# Patient Record
Sex: Male | Born: 1935
Health system: Southern US, Community
[De-identification: ages and names within clinical notes are randomized; demographics above are authoritative.]

## PROBLEM LIST (undated history)

## (undated) DIAGNOSIS — I10 Essential (primary) hypertension: Secondary | ICD-10-CM

## (undated) DIAGNOSIS — K259 Gastric ulcer, unspecified as acute or chronic, without hemorrhage or perforation: Secondary | ICD-10-CM

## (undated) DIAGNOSIS — N189 Chronic kidney disease, unspecified: Secondary | ICD-10-CM

## (undated) DIAGNOSIS — E785 Hyperlipidemia, unspecified: Secondary | ICD-10-CM

## (undated) DIAGNOSIS — R809 Proteinuria, unspecified: Secondary | ICD-10-CM

## (undated) DIAGNOSIS — K219 Gastro-esophageal reflux disease without esophagitis: Secondary | ICD-10-CM

## (undated) DIAGNOSIS — E119 Type 2 diabetes mellitus without complications: Secondary | ICD-10-CM

## (undated) DIAGNOSIS — R350 Frequency of micturition: Secondary | ICD-10-CM

## (undated) DIAGNOSIS — Z9289 Personal history of other medical treatment: Secondary | ICD-10-CM

## (undated) DIAGNOSIS — H409 Unspecified glaucoma: Secondary | ICD-10-CM

## (undated) DIAGNOSIS — Z8709 Personal history of other diseases of the respiratory system: Secondary | ICD-10-CM

## (undated) DIAGNOSIS — D649 Anemia, unspecified: Secondary | ICD-10-CM

## (undated) DIAGNOSIS — L988 Other specified disorders of the skin and subcutaneous tissue: Secondary | ICD-10-CM

## (undated) DIAGNOSIS — G629 Polyneuropathy, unspecified: Secondary | ICD-10-CM

## (undated) HISTORY — DX: Proteinuria, unspecified: R80.9

## (undated) HISTORY — DX: Type 2 diabetes mellitus without complications: E11.9

## (undated) HISTORY — PX: ESOPHAGOGASTRODUODENOSCOPY: SHX1529

## (undated) HISTORY — DX: Chronic kidney disease, unspecified: N18.9

## (undated) HISTORY — DX: Essential (primary) hypertension: I10

## (undated) HISTORY — DX: Anemia, unspecified: D64.9

## (undated) HISTORY — PX: COLONOSCOPY: SHX174

## (undated) HISTORY — PX: OTHER SURGICAL HISTORY: SHX169

## (undated) HISTORY — DX: Hyperlipidemia, unspecified: E78.5

---

## 2012-06-20 ENCOUNTER — Other Ambulatory Visit (HOSPITAL_COMMUNITY): Payer: Self-pay | Admitting: Nephrology

## 2012-06-20 DIAGNOSIS — N289 Disorder of kidney and ureter, unspecified: Secondary | ICD-10-CM

## 2012-07-12 ENCOUNTER — Ambulatory Visit (HOSPITAL_COMMUNITY)
Admission: RE | Admit: 2012-07-12 | Discharge: 2012-07-12 | Disposition: A | Discharge status: Home / Self Care | Payer: Medicare Other | Source: Ambulatory Visit | Attending: Nephrology | Admitting: Nephrology

## 2012-07-12 DIAGNOSIS — Q619 Cystic kidney disease, unspecified: Secondary | ICD-10-CM | POA: Insufficient documentation

## 2012-07-12 DIAGNOSIS — N289 Disorder of kidney and ureter, unspecified: Secondary | ICD-10-CM | POA: Insufficient documentation

## 2012-08-16 ENCOUNTER — Encounter (INDEPENDENT_AMBULATORY_CARE_PROVIDER_SITE_OTHER): Payer: Medicare Other | Admitting: Ophthalmology

## 2012-08-16 DIAGNOSIS — E11319 Type 2 diabetes mellitus with unspecified diabetic retinopathy without macular edema: Secondary | ICD-10-CM

## 2012-08-16 DIAGNOSIS — H43819 Vitreous degeneration, unspecified eye: Secondary | ICD-10-CM

## 2012-08-16 DIAGNOSIS — E1139 Type 2 diabetes mellitus with other diabetic ophthalmic complication: Secondary | ICD-10-CM

## 2012-10-04 ENCOUNTER — Encounter (INDEPENDENT_AMBULATORY_CARE_PROVIDER_SITE_OTHER): Payer: Medicare Other | Admitting: Ophthalmology

## 2012-10-04 DIAGNOSIS — E1139 Type 2 diabetes mellitus with other diabetic ophthalmic complication: Secondary | ICD-10-CM

## 2012-10-04 DIAGNOSIS — E1165 Type 2 diabetes mellitus with hyperglycemia: Secondary | ICD-10-CM

## 2012-10-04 DIAGNOSIS — E11319 Type 2 diabetes mellitus with unspecified diabetic retinopathy without macular edema: Secondary | ICD-10-CM

## 2012-10-04 DIAGNOSIS — H43819 Vitreous degeneration, unspecified eye: Secondary | ICD-10-CM

## 2012-10-04 DIAGNOSIS — H35359 Cystoid macular degeneration, unspecified eye: Secondary | ICD-10-CM

## 2012-11-15 ENCOUNTER — Encounter (INDEPENDENT_AMBULATORY_CARE_PROVIDER_SITE_OTHER): Payer: Medicare Other | Admitting: Ophthalmology

## 2012-11-15 DIAGNOSIS — H43819 Vitreous degeneration, unspecified eye: Secondary | ICD-10-CM

## 2012-11-15 DIAGNOSIS — H35359 Cystoid macular degeneration, unspecified eye: Secondary | ICD-10-CM

## 2012-11-15 DIAGNOSIS — E1139 Type 2 diabetes mellitus with other diabetic ophthalmic complication: Secondary | ICD-10-CM

## 2012-11-15 DIAGNOSIS — E11319 Type 2 diabetes mellitus with unspecified diabetic retinopathy without macular edema: Secondary | ICD-10-CM

## 2013-03-30 ENCOUNTER — Other Ambulatory Visit: Payer: Self-pay | Admitting: *Deleted

## 2013-03-30 ENCOUNTER — Encounter: Payer: Self-pay | Admitting: Vascular Surgery

## 2013-03-30 DIAGNOSIS — N184 Chronic kidney disease, stage 4 (severe): Secondary | ICD-10-CM

## 2013-03-30 DIAGNOSIS — Z01810 Encounter for preprocedural cardiovascular examination: Secondary | ICD-10-CM

## 2013-04-10 ENCOUNTER — Encounter: Payer: Self-pay | Admitting: Vascular Surgery

## 2013-04-11 ENCOUNTER — Ambulatory Visit (INDEPENDENT_AMBULATORY_CARE_PROVIDER_SITE_OTHER): Payer: Medicare Other | Admitting: Vascular Surgery

## 2013-04-11 ENCOUNTER — Other Ambulatory Visit: Payer: Self-pay | Admitting: *Deleted

## 2013-04-11 ENCOUNTER — Encounter (INDEPENDENT_AMBULATORY_CARE_PROVIDER_SITE_OTHER): Payer: Medicare Other | Admitting: *Deleted

## 2013-04-11 ENCOUNTER — Encounter (HOSPITAL_COMMUNITY): Payer: Self-pay | Admitting: Pharmacy Technician

## 2013-04-11 ENCOUNTER — Encounter: Payer: Self-pay | Admitting: Vascular Surgery

## 2013-04-11 VITALS — BP 183/88 | HR 77 | Ht 72.0 in | Wt 194.9 lb

## 2013-04-11 DIAGNOSIS — N184 Chronic kidney disease, stage 4 (severe): Secondary | ICD-10-CM

## 2013-04-11 DIAGNOSIS — Z01810 Encounter for preprocedural cardiovascular examination: Secondary | ICD-10-CM

## 2013-04-11 DIAGNOSIS — N186 End stage renal disease: Secondary | ICD-10-CM | POA: Insufficient documentation

## 2013-04-11 NOTE — Progress Notes (Signed)
[facility] [place]  Referred by:  Harriett Sine, MD [address] [place], [place] [address]  Reason for referral: New access  History of Present Illness  Nathaniel Martinez is a 79 y.o. ([birth year]) male who presents for evaluation for permanent access.  The patient is right hand dominant.  The patient has not had previous access procedures.  Previous central venous cannulation procedures include: non.  The patient has not had a  PPM placed.   Past Medical History  Diagnosis Date  . Chronic kidney disease   . Hypertension   . Anemia   . Diabetes mellitus without complication   . Hyperlipidemia   . Proteinuria     Past Surgical History  Procedure Laterality Date  . Vasectomy      History   Social History  . Marital Status: Married    Spouse Name: N/A    Number of Children: N/A  . Years of Education: N/A   Occupational History  . Not on file.   Social History Main Topics  . Smoking status: Former Smoker    Types: Cigarettes    Quit date: 11/01/1966  . Smokeless tobacco: Not on file  . Alcohol Use: No  . Drug Use: No  . Sexually Active: Not on file   Other Topics Concern  . Not on file   Social History Narrative  . No narrative on file    Family History  Problem Relation Age of Onset  . Cancer Mother   . Cancer Brother   . Diabetes Brother   . Heart attack Brother   . Other Brother     amputation      Current Outpatient Prescriptions on File Prior to Visit  Medication Sig Dispense Refill  . Cholecalciferol (VITAMIN D3) 1000 UNITS CAPS Take 1,000 capsules by mouth daily.      Marland Kitchen glipiZIDE (GLUCOTROL) 5 MG tablet Take 5 mg by mouth daily.      Marland Kitchen losartan (COZAAR) 50 MG tablet Take 50 mg by mouth daily. Take 11/2 tablets daily      . simvastatin (ZOCOR) 20 MG tablet Take 20 mg by mouth every evening.      . sitaGLIPtin (JANUVIA) 25 MG tablet Take 25 mg by mouth daily.       No current facility-administered medications  on file prior to visit.    Allergies  Allergen Reactions  . Penicillins Other (See Comments)    Pt states "it had been 40 years ago and I don't remember there reaction"      REVIEW OF SYSTEMS:  (Positives checked otherwise negative)  CARDIOVASCULAR:  []  chest pain, []  chest pressure, []  palpitations, []  shortness of breath when laying flat, []  shortness of breath with exertion,  []  pain in feet when walking, []  pain in feet when laying flat, []  history of blood clot in veins (DVT), []  history of phlebitis, []  swelling in legs, []  varicose veins  PULMONARY:  []  productive cough, []  asthma, []  wheezing  NEUROLOGIC:  []  weakness in arms or legs, []  numbness in arms or legs, []  difficulty speaking or slurred speech, []  temporary loss of vision in one eye, []  dizziness  HEMATOLOGIC:  []  bleeding problems, []  problems with blood clotting too easily  MUSCULOSKEL:  [x]  joint pain, []  joint swelling  GASTROINTEST:  []  vomiting blood, []  blood in stool     GENITOURINARY:  []  burning with urination, []  blood in urine  PSYCHIATRIC:  []  history of major depression  INTEGUMENTARY:  []  rashes, []  ulcers  CONSTITUTIONAL:  []  fever, []  chills  PRE-ADM LIVING: [x]  Home, []  Nursing home, []  Homeless  AMB STATUS: []  Walking, [x]  Walking w/ Assistance cane, []  Wheelchair, [] Bed ridden   Physical Examination  Filed Vitals:   04/11/13 1055  BP: 183/88  Pulse: 77  Height: 6' (1.829 m)  Weight: 194 lb 14.4 oz (88.406 kg)  SpO2: 100%    Body mass index is 26.43 kg/(m^2).  General: A&O x 3, WDN, Head: [place]/AT, Temporalis wasting, , Prominent temp pulse,  Ear/Nose/Throat: Hearing grossly intact, nares w/o erythema or drainage, oropharynx w/o Erythema/Exudate, Mallampati score: , , Hearing loss uses hearing aids, , Nasal drainage  Eyes: PERRLA, EOMI, Post surg chg to pupils,  Neck: Supple,  Pulmonary: Sym exp, good air movt, CTAB, no rales, rhonchi, & wheezing, Cardiac:  RRR,Vascular: Vessel Right Left  Radial Palpable Palpable  Ulnar Palpable Palpable  Brachial Palpable Palpable  Carotid Palpable, without bruit Palpable, without bruit  Aorta Not palpable N/A  Femoral Palpable Palpable  Popliteal Not palpable Not palpable  PT  Palpable  Palpable  DP  Palpable  Palpable   Gastrointestinal: soft, NTND + BS Musculoskeletal: M/S 5/5 throughout, Extremities without ischemic changes  Neurologic: Pain and light touch intact in extremities Psychiatric: Judgment intact, Mood & affect appropriate for pt's clinical situation, Dermatologic: See M/S exam for extremity exam, no rashes otherwise noted  Lymph : No Cervical, Axillary, or Inguinal lymphadenopathy  e  Non-Invasive Vascular Imaging  Vein Mapping  ([id number] ):   Left cephalic .0.37 -0.44 AC to upper arm  Outside Studies/Documentation  outside documents were reviewed including: Labs and office notes.  Medical Decision Making  Nathaniel Martinez is a 79 y.o. male who presents with  ESRD requiring hemodialysis.   Based on vein mapping and examination, this patient's permanent access options include: Left cephalic vein  AV fistula verse graft creation 04/17/2013 by Dr. Scot Dock.  Theda Sers, Dorotea Hand [facility] PA-C [facility] [place] Office: [phone]   04/11/2013, 1:05 PM  Agree with above. He appears to be a good candidate for a left arm AV fistula. I have explained the indications for placement of an AV fistula or AV graft. I've explained that if at all possible we will place an AV fistula.  I have reviewed the risks of placement of an AV fistula including but not limited to: failure of the fistula to mature, need for subsequent interventions, and thrombosis. In addition I have reviewed the potential complications of placement of an AV graft. These risks include, but are not limited to, graft thrombosis, graft infection, wound healing problems, bleeding, arm swelling, and  steal syndrome. All the patient's questions were answered and they are agreeable to proceed with surgery.  Deitra Mayo, MD, [place] [phone] 04/11/2013

## 2013-04-12 ENCOUNTER — Encounter: Payer: Self-pay | Admitting: Nephrology

## 2013-04-16 ENCOUNTER — Encounter (HOSPITAL_COMMUNITY): Payer: Self-pay | Admitting: *Deleted

## 2013-04-16 MED ORDER — VANCOMYCIN HCL 10 G IV SOLR
1500.0000 mg | INTRAVENOUS | Status: DC
  Filled 2013-04-16: qty 1500

## 2013-04-16 NOTE — Progress Notes (Signed)
Pt doesn't have a cardiologist  Denies ever having a stress test/echo/heart cath  Medical Md is Dr.Fanta in [place]   Denies ekg or cxr in past yr

## 2013-04-17 ENCOUNTER — Other Ambulatory Visit: Payer: Self-pay | Admitting: *Deleted

## 2013-04-17 ENCOUNTER — Encounter (HOSPITAL_COMMUNITY): Payer: Self-pay | Admitting: *Deleted

## 2013-04-17 ENCOUNTER — Ambulatory Visit (HOSPITAL_COMMUNITY): Payer: Medicare Other

## 2013-04-17 ENCOUNTER — Ambulatory Visit (HOSPITAL_COMMUNITY): Payer: Medicare Other | Admitting: Certified Registered"

## 2013-04-17 ENCOUNTER — Telehealth: Payer: Self-pay | Admitting: Vascular Surgery

## 2013-04-17 ENCOUNTER — Other Ambulatory Visit: Payer: Self-pay

## 2013-04-17 ENCOUNTER — Encounter (HOSPITAL_COMMUNITY)
Admission: RE | Disposition: A | Discharge status: Home / Self Care | Payer: Self-pay | Source: Ambulatory Visit | Attending: Vascular Surgery

## 2013-04-17 ENCOUNTER — Ambulatory Visit (HOSPITAL_COMMUNITY)
Admission: RE | Admit: 2013-04-17 | Discharge: 2013-04-17 | Disposition: A | Discharge status: Home / Self Care | Payer: Medicare Other | Source: Ambulatory Visit | Attending: Vascular Surgery | Admitting: Vascular Surgery

## 2013-04-17 ENCOUNTER — Encounter (HOSPITAL_COMMUNITY): Payer: Self-pay | Admitting: Certified Registered"

## 2013-04-17 DIAGNOSIS — E669 Obesity, unspecified: Secondary | ICD-10-CM | POA: Insufficient documentation

## 2013-04-17 DIAGNOSIS — N186 End stage renal disease: Secondary | ICD-10-CM

## 2013-04-17 DIAGNOSIS — E785 Hyperlipidemia, unspecified: Secondary | ICD-10-CM | POA: Insufficient documentation

## 2013-04-17 DIAGNOSIS — K219 Gastro-esophageal reflux disease without esophagitis: Secondary | ICD-10-CM | POA: Insufficient documentation

## 2013-04-17 DIAGNOSIS — Z9852 Vasectomy status: Secondary | ICD-10-CM | POA: Insufficient documentation

## 2013-04-17 DIAGNOSIS — Z6825 Body mass index (BMI) 25.0-25.9, adult: Secondary | ICD-10-CM | POA: Insufficient documentation

## 2013-04-17 DIAGNOSIS — Z4931 Encounter for adequacy testing for hemodialysis: Secondary | ICD-10-CM

## 2013-04-17 DIAGNOSIS — I120 Hypertensive chronic kidney disease with stage 5 chronic kidney disease or end stage renal disease: Secondary | ICD-10-CM | POA: Insufficient documentation

## 2013-04-17 DIAGNOSIS — Z880 Allergy status to penicillin: Secondary | ICD-10-CM | POA: Insufficient documentation

## 2013-04-17 DIAGNOSIS — E119 Type 2 diabetes mellitus without complications: Secondary | ICD-10-CM | POA: Insufficient documentation

## 2013-04-17 DIAGNOSIS — D649 Anemia, unspecified: Secondary | ICD-10-CM | POA: Insufficient documentation

## 2013-04-17 DIAGNOSIS — Z87891 Personal history of nicotine dependence: Secondary | ICD-10-CM | POA: Insufficient documentation

## 2013-04-17 DIAGNOSIS — Z79899 Other long term (current) drug therapy: Secondary | ICD-10-CM | POA: Insufficient documentation

## 2013-04-17 HISTORY — DX: Personal history of other medical treatment: Z92.89

## 2013-04-17 HISTORY — DX: Gastric ulcer, unspecified as acute or chronic, without hemorrhage or perforation: K25.9

## 2013-04-17 HISTORY — PX: AV FISTULA PLACEMENT: SHX1204

## 2013-04-17 HISTORY — DX: Personal history of other diseases of the respiratory system: Z87.09

## 2013-04-17 HISTORY — DX: Gastro-esophageal reflux disease without esophagitis: K21.9

## 2013-04-17 HISTORY — DX: Frequency of micturition: R35.0

## 2013-04-17 HISTORY — DX: Polyneuropathy, unspecified: G62.9

## 2013-04-17 HISTORY — DX: Unspecified glaucoma: H40.9

## 2013-04-17 LAB — POCT I-STAT 4, (NA,K, GLUC, HGB,HCT)
HCT: 33 % — ABNORMAL LOW (ref 39.0–52.0)
Hemoglobin: 11.2 g/dL — ABNORMAL LOW (ref 13.0–17.0)

## 2013-04-17 LAB — GLUCOSE, CAPILLARY: Glucose-Capillary: 122 mg/dL — ABNORMAL HIGH (ref 70–99)

## 2013-04-17 SURGERY — ARTERIOVENOUS (AV) FISTULA CREATION
Anesthesia: Monitor Anesthesia Care | Site: Arm Lower | Laterality: Left | Wound class: Clean

## 2013-04-17 MED ORDER — PROPOFOL INFUSION 10 MG/ML OPTIME
INTRAVENOUS | Status: DC | PRN
  Administered 2013-04-17: 75 ug/kg/min via INTRAVENOUS

## 2013-04-17 MED ORDER — MIDAZOLAM HCL 5 MG/5ML IJ SOLN
INTRAMUSCULAR | Status: DC | PRN
  Administered 2013-04-17 (×2): 1 mg via INTRAVENOUS

## 2013-04-17 MED ORDER — LIDOCAINE HCL (CARDIAC) 20 MG/ML IV SOLN
INTRAVENOUS | Status: DC | PRN
  Administered 2013-04-17: 60 mg via INTRAVENOUS

## 2013-04-17 MED ORDER — ONDANSETRON HCL 4 MG/2ML IJ SOLN
INTRAMUSCULAR | Status: DC | PRN
  Administered 2013-04-17: 4 mg via INTRAVENOUS

## 2013-04-17 MED ORDER — 0.9 % SODIUM CHLORIDE (POUR BTL) OPTIME
TOPICAL | Status: DC | PRN
  Administered 2013-04-17: 1000 mL

## 2013-04-17 MED ORDER — OXYCODONE-ACETAMINOPHEN 5-325 MG PO TABS: 1.0000 | ORAL_TABLET | ORAL | Status: DC | PRN

## 2013-04-17 MED ORDER — SODIUM CHLORIDE 0.9 % IR SOLN
Status: DC | PRN
  Administered 2013-04-17: 10:00:00

## 2013-04-17 MED ORDER — MUPIROCIN 2 % EX OINT
TOPICAL_OINTMENT | CUTANEOUS | Status: AC
  Filled 2013-04-17: qty 22

## 2013-04-17 MED ORDER — OXYCODONE HCL 5 MG/5ML PO SOLN: 5.0000 mg | Freq: Once | ORAL | Status: DC | PRN

## 2013-04-17 MED ORDER — LIDOCAINE HCL (PF) 1 % IJ SOLN
INTRAMUSCULAR | Status: DC | PRN
  Administered 2013-04-17: 10 mL via INTRADERMAL

## 2013-04-17 MED ORDER — OXYCODONE HCL 5 MG PO TABS: 5.0000 mg | ORAL_TABLET | Freq: Once | ORAL | Status: DC | PRN

## 2013-04-17 MED ORDER — THROMBIN 20000 UNITS EX SOLR
CUTANEOUS | Status: AC
  Filled 2013-04-17: qty 20000

## 2013-04-17 MED ORDER — SODIUM CHLORIDE 0.9 % IV SOLN
INTRAVENOUS | Status: DC
  Administered 2013-04-17: 35 mL/h via INTRAVENOUS

## 2013-04-17 MED ORDER — FENTANYL CITRATE 0.05 MG/ML IJ SOLN: 50.0000 ug | Freq: Once | INTRAMUSCULAR | Status: DC

## 2013-04-17 MED ORDER — HEPARIN SODIUM (PORCINE) 1000 UNIT/ML IJ SOLN
INTRAMUSCULAR | Status: DC | PRN
  Administered 2013-04-17: 7000 [IU] via INTRAVENOUS

## 2013-04-17 MED ORDER — MIDAZOLAM HCL 2 MG/2ML IJ SOLN: 1.0000 mg | INTRAMUSCULAR | Status: DC | PRN

## 2013-04-17 MED ORDER — VANCOMYCIN HCL IN DEXTROSE 1-5 GM/200ML-% IV SOLN
INTRAVENOUS | Status: AC
  Administered 2013-04-17: 1000 mg via INTRAVENOUS
  Filled 2013-04-17: qty 200

## 2013-04-17 MED ORDER — SODIUM CHLORIDE 0.9 % IV SOLN
INTRAVENOUS | Status: DC | PRN
  Administered 2013-04-17: 08:00:00 via INTRAVENOUS

## 2013-04-17 MED ORDER — LIDOCAINE HCL (PF) 1 % IJ SOLN
INTRAMUSCULAR | Status: AC
  Filled 2013-04-17: qty 30

## 2013-04-17 MED ORDER — FENTANYL CITRATE 0.05 MG/ML IJ SOLN
INTRAMUSCULAR | Status: DC | PRN
  Administered 2013-04-17: 50 ug via INTRAVENOUS

## 2013-04-17 MED ORDER — MUPIROCIN 2 % EX OINT: TOPICAL_OINTMENT | Freq: Once | CUTANEOUS | Status: DC

## 2013-04-17 MED ORDER — FENTANYL CITRATE 0.05 MG/ML IJ SOLN: 25.0000 ug | INTRAMUSCULAR | Status: DC | PRN

## 2013-04-17 SURGICAL SUPPLY — 38 items
ARMBAND PINK RESTRICT EXTREMIT (MISCELLANEOUS) ×4 IMPLANT
CANISTER SUCTION 2500CC (MISCELLANEOUS) ×2 IMPLANT
CLIP TI MEDIUM 6 (CLIP) ×2 IMPLANT
CLIP TI WIDE RED SMALL 6 (CLIP) ×2 IMPLANT
CLOTH BEACON ORANGE TIMEOUT ST (SAFETY) ×2 IMPLANT
COVER PROBE W GEL 5X96 (DRAPES) IMPLANT
COVER SURGICAL LIGHT HANDLE (MISCELLANEOUS) ×2 IMPLANT
DECANTER SPIKE VIAL GLASS SM (MISCELLANEOUS) IMPLANT
DERMABOND ADVANCED (GAUZE/BANDAGES/DRESSINGS) ×1
DERMABOND ADVANCED .7 DNX12 (GAUZE/BANDAGES/DRESSINGS) ×1 IMPLANT
DRAIN PENROSE 1/2X12 LTX STRL (WOUND CARE) IMPLANT
ELECT REM PT RETURN 9FT ADLT (ELECTROSURGICAL) ×2
ELECTRODE REM PT RTRN 9FT ADLT (ELECTROSURGICAL) ×1 IMPLANT
GLOVE BIO SURGEON STRL SZ7.5 (GLOVE) ×2 IMPLANT
GLOVE BIOGEL PI IND STRL 7.0 (GLOVE) ×3 IMPLANT
GLOVE BIOGEL PI IND STRL 8 (GLOVE) ×1 IMPLANT
GLOVE BIOGEL PI INDICATOR 7.0 (GLOVE) ×3
GLOVE BIOGEL PI INDICATOR 8 (GLOVE) ×1
GLOVE ECLIPSE 6.5 STRL STRAW (GLOVE) ×4 IMPLANT
GLOVE SS BIOGEL STRL SZ 6.5 (GLOVE) ×1 IMPLANT
GLOVE SUPERSENSE BIOGEL SZ 6.5 (GLOVE) ×1
GOWN PREVENTION PLUS XXLARGE (GOWN DISPOSABLE) ×2 IMPLANT
GOWN STRL NON-REIN LRG LVL3 (GOWN DISPOSABLE) ×4 IMPLANT
KIT BASIN OR (CUSTOM PROCEDURE TRAY) ×2 IMPLANT
KIT ROOM TURNOVER OR (KITS) ×2 IMPLANT
NS IRRIG 1000ML POUR BTL (IV SOLUTION) ×2 IMPLANT
PACK CV ACCESS (CUSTOM PROCEDURE TRAY) ×2 IMPLANT
PAD ARMBOARD 7.5X6 YLW CONV (MISCELLANEOUS) ×4 IMPLANT
SPONGE GAUZE 4X4 12PLY (GAUZE/BANDAGES/DRESSINGS) ×2 IMPLANT
SPONGE SURGIFOAM ABS GEL 100 (HEMOSTASIS) IMPLANT
SUT PROLENE 6 0 BV (SUTURE) ×4 IMPLANT
SUT VIC AB 3-0 SH 27 (SUTURE) ×1
SUT VIC AB 3-0 SH 27X BRD (SUTURE) ×1 IMPLANT
SUT VICRYL 4-0 PS2 18IN ABS (SUTURE) ×2 IMPLANT
TOWEL OR 17X24 6PK STRL BLUE (TOWEL DISPOSABLE) ×2 IMPLANT
TOWEL OR 17X26 10 PK STRL BLUE (TOWEL DISPOSABLE) ×2 IMPLANT
UNDERPAD 30X30 INCONTINENT (UNDERPADS AND DIAPERS) ×2 IMPLANT
WATER STERILE IRR 1000ML POUR (IV SOLUTION) ×2 IMPLANT

## 2013-04-17 NOTE — Anesthesia Postprocedure Evaluation (Signed)
  Anesthesia Post-op Note  Patient: Nathaniel Martinez  Procedure(s) Performed: Procedure(s) with comments: ARTERIOVENOUS (AV) FISTULA CREATION (Left) - Left arm brachiocephalic arteriovneous fistula  Patient Location: PACU  Anesthesia Type:MAC  Level of Consciousness: awake and alert   Airway and Oxygen Therapy: Patient Spontanous Breathing  Post-op Pain: mild  Post-op Assessment: Post-op Vital signs reviewed, Patient's Cardiovascular Status Stable, Respiratory Function Stable, Patent Airway, No signs of Nausea or vomiting and Pain level controlled  Post-op Vital Signs: stable  Complications: No apparent anesthesia complications

## 2013-04-17 NOTE — Telephone Encounter (Addendum)
Message copied by Doristine Section on Tue Apr 17, 2013 11:27 AM ------      Message from: Alfonso Patten      Created: Tue Apr 17, 2013 10:21 AM      Regarding: FW: charge and f/u                   ----- Message -----         From: Angelia Mould, MD         Sent: 04/17/2013  10:14 AM           To: Patrici Ranks, Alfonso Patten, RN, #      Subject: charge and f/u                                           This patient had a left brachiocephalic AV fistula. Assistant Wray Kearns. He needs a follow up visit in 6 weeks with a duplex of his fistula to check on maturation of this fistula. Thank you. CD ------  notified patient of fu appt. on 05-30-13 at 11:00 for lab and 12:30 to see doctor

## 2013-04-17 NOTE — Anesthesia Preprocedure Evaluation (Addendum)
Anesthesia Evaluation  Patient identified by MRN, date of birth, ID band Patient awake    Reviewed: Allergy & Precautions, H&P , NPO status , Patient's Chart, lab work & pertinent test results, reviewed documented beta blocker date and time   Airway Mallampati: II TM Distance: >3 FB Neck ROM: Full    Dental  (+) Poor Dentition, Missing and Dental Advisory Given   Pulmonary former smoker,  breath sounds clear to auscultation        Cardiovascular hypertension, Pt. on medications Rhythm:Regular Rate:Normal     Neuro/Psych  Neuromuscular disease    GI/Hepatic PUD, GERD-  Medicated and Controlled,  Endo/Other  diabetes, Well Controlled, Oral Hypoglycemic Agents  Renal/GU CRFRenal disease     Musculoskeletal   Abdominal (+) + obese,  Abdomen: soft. Bowel sounds: normal.  Peds  Hematology   Anesthesia Other Findings   Reproductive/Obstetrics                         Anesthesia Physical Anesthesia Plan  ASA: III  Anesthesia Plan: MAC   Post-op Pain Management:    Induction: Intravenous  Airway Management Planned: Simple Face Mask  Additional Equipment:   Intra-op Plan:   Post-operative Plan:   Informed Consent: I have reviewed the patients History and Physical, chart, labs and discussed the procedure including the risks, benefits and alternatives for the proposed anesthesia with the patient or authorized representative who has indicated his/her understanding and acceptance.     Plan Discussed with: CRNA and Surgeon  Anesthesia Plan Comments:         Anesthesia Quick Evaluation

## 2013-04-17 NOTE — Anesthesia Procedure Notes (Signed)
Procedure Name: MAC Date/Time: 04/17/2013 9:14 AM Performed by: Maeola Harman Pre-anesthesia Checklist: Patient identified, Emergency Drugs available, Suction available, Patient being monitored and Timeout performed Patient Re-evaluated:Patient Re-evaluated prior to inductionOxygen Delivery Method: Simple face mask Preoxygenation: Pre-oxygenation with 100% oxygen Intubation Type: IV induction Placement Confirmation: positive ETCO2 and breath sounds checked- equal and bilateral Dental Injury: Teeth and Oropharynx as per pre-operative assessment

## 2013-04-17 NOTE — Interval H&P Note (Signed)
History and Physical Interval Note:  04/17/2013 8:52 AM  Nathaniel Martinez  has presented today for surgery, with the diagnosis of ESRD  The various methods of treatment have been discussed with the patient and family. After consideration of risks, benefits and other options for treatment, the patient has consented to  Procedure(s): ARTERIOVENOUS (AV) FISTULA CREATION (Left) as a surgical intervention .  The patient's history has been reviewed, patient examined, no change in status, stable for surgery.  I have reviewed the patient's chart and labs.  Questions were answered to the patient's satisfaction.     Lanaya Bennis S

## 2013-04-17 NOTE — Transfer of Care (Signed)
Immediate Anesthesia Transfer of Care Note  Patient: Nathaniel Martinez  Procedure(s) Performed: Procedure(s) with comments: ARTERIOVENOUS (AV) FISTULA CREATION (Left) - Left arm brachiocephalic arteriovneous fistula  Patient Location: PACU  Anesthesia Type:MAC  Level of Consciousness: awake, alert  and sedated  Airway & Oxygen Therapy: Patient connected to face mask  Post-op Assessment: Report given to PACU RN  Post vital signs: stable  Complications: No apparent anesthesia complications

## 2013-04-17 NOTE — Op Note (Signed)
NAME: Nathaniel Martinez   MRN: [id number] DOB: 10/09/1933    DATE OF OPERATION: 04/17/2013  PREOP DIAGNOSIS: stage IV chronic kidney disease  POSTOP DIAGNOSIS: same  PROCEDURE: left brachial cephalic AV fistula  SURGEON: Judeth Cornfield. Scot Dock, MD, FACS  ASSIST: Wray Kearns PA  ANESTHESIA: local with sedation   EBL: minimal  INDICATIONS: [name] is a 79 y.o. male who is not yet on dialysis. He presents for new access.  FINDINGS: 0.5 mm cephalic vein. 4 mm radial artery.  TECHNIQUE: The patient was brought to the operating room and received sedation. The left upper extremity was prepped and draped in the usual sterile fashion. After the skin was anesthetized with 1% lidocaine, a transverse incision was made at the antecubital level.. The cephalic vein was dissected free. It was ligated distally and irrigated up nicely with heparinized saline. The brachial artery was dissected free beneath the fascia. The patient was heparinized. The brachial artery was clamped proximally and distally and a longitudinal arteriotomy was made. The vein was slightly spatulated and sewn end to side to the artery using continuous 6-0 Prolene suture. At the completion was an excellent thrill in the fistula. There was a good radial and ulnar signal with the Doppler. There was a palpable radial pulse. The wound was closed with a deep layer of 3-0 Vicryl and the skin closed with 4-0 Vicryl. Dermabond was applied. The patient tolerated the procedure well and was transferred to the recovery room in stable condition. All needle and sponge counts were correct.  Deitra Mayo, MD, FACS [facility]  DATE OF DICTATION:   04/17/2013

## 2013-04-17 NOTE — H&P (View-Only) (Signed)
[facility] [place]  Referred by:  Harriett Sine, MD [address] [place], [place] [address]  Reason for referral: New access  History of Present Illness  Nathaniel Martinez is a 79 y.o. (Oct 20, 1933) male who presents for evaluation for permanent access.  The patient is right hand dominant.  The patient has not had previous access procedures.  Previous central venous cannulation procedures include: non.  The patient has not had a  PPM placed.   Past Medical History  Diagnosis Date  . Chronic kidney disease   . Hypertension   . Anemia   . Diabetes mellitus without complication   . Hyperlipidemia   . Proteinuria     Past Surgical History  Procedure Laterality Date  . Vasectomy      History   Social History  . Marital Status: Married    Spouse Name: N/A    Number of Children: N/A  . Years of Education: N/A   Occupational History  . Not on file.   Social History Main Topics  . Smoking status: Former Smoker    Types: Cigarettes    Quit date: 11/01/1966  . Smokeless tobacco: Not on file  . Alcohol Use: No  . Drug Use: No  . Sexually Active: Not on file   Other Topics Concern  . Not on file   Social History Narrative  . No narrative on file    Family History  Problem Relation Age of Onset  . Cancer Mother   . Cancer Brother   . Diabetes Brother   . Heart attack Brother   . Other Brother     amputation      Current Outpatient Prescriptions on File Prior to Visit  Medication Sig Dispense Refill  . Cholecalciferol (VITAMIN D3) 1000 UNITS CAPS Take 1,000 capsules by mouth daily.      Marland Kitchen glipiZIDE (GLUCOTROL) 5 MG tablet Take 5 mg by mouth daily.      Marland Kitchen losartan (COZAAR) 50 MG tablet Take 50 mg by mouth daily. Take 11/2 tablets daily      . simvastatin (ZOCOR) 20 MG tablet Take 20 mg by mouth every evening.      . sitaGLIPtin (JANUVIA) 25 MG tablet Take 25 mg by mouth daily.       No current facility-administered medications  on file prior to visit.    Allergies  Allergen Reactions  . Penicillins Other (See Comments)    Pt states "it had been 40 years ago and I don't remember there reaction"      REVIEW OF SYSTEMS:  (Positives checked otherwise negative)  CARDIOVASCULAR:  []  chest pain, []  chest pressure, []  palpitations, []  shortness of breath when laying flat, []  shortness of breath with exertion,  []  pain in feet when walking, []  pain in feet when laying flat, []  history of blood clot in veins (DVT), []  history of phlebitis, []  swelling in legs, []  varicose veins  PULMONARY:  []  productive cough, []  asthma, []  wheezing  NEUROLOGIC:  []  weakness in arms or legs, []  numbness in arms or legs, []  difficulty speaking or slurred speech, []  temporary loss of vision in one eye, []  dizziness  HEMATOLOGIC:  []  bleeding problems, []  problems with blood clotting too easily  MUSCULOSKEL:  [x]  joint pain, []  joint swelling  GASTROINTEST:  []  vomiting blood, []  blood in stool     GENITOURINARY:  []  burning with urination, []  blood in urine  PSYCHIATRIC:  []  history of major depression  INTEGUMENTARY:  []  rashes, []  ulcers  CONSTITUTIONAL:  []  fever, []  chills  PRE-ADM LIVING: [x]  Home, []  Nursing home, []  Homeless  AMB STATUS: []  Walking, [x]  Walking w/ Assistance cane, []  Wheelchair, [] Bed ridden   Physical Examination  Filed Vitals:   04/11/13 1055  BP: 183/88  Pulse: 77  Height: 6' (1.829 m)  Weight: 194 lb 14.4 oz (88.406 kg)  SpO2: 100%    Body mass index is 26.43 kg/(m^2).  General: A&O x 3, WDN, Head: [place]/AT, Temporalis wasting, , Prominent temp pulse,  Ear/Nose/Throat: Hearing grossly intact, nares w/o erythema or drainage, oropharynx w/o Erythema/Exudate, Mallampati score: , , Hearing loss uses hearing aids, , Nasal drainage  Eyes: PERRLA, EOMI, Post surg chg to pupils,  Neck: Supple,  Pulmonary: Sym exp, good air movt, CTAB, no rales, rhonchi, & wheezing, Cardiac:  RRR,Vascular: Vessel Right Left  Radial Palpable Palpable  Ulnar Palpable Palpable  Brachial Palpable Palpable  Carotid Palpable, without bruit Palpable, without bruit  Aorta Not palpable N/A  Femoral Palpable Palpable  Popliteal Not palpable Not palpable  PT  Palpable  Palpable  DP  Palpable  Palpable   Gastrointestinal: soft, NTND + BS Musculoskeletal: M/S 5/5 throughout, Extremities without ischemic changes  Neurologic: Pain and light touch intact in extremities Psychiatric: Judgment intact, Mood & affect appropriate for pt's clinical situation, Dermatologic: See M/S exam for extremity exam, no rashes otherwise noted  Lymph : No Cervical, Axillary, or Inguinal lymphadenopathy  e  Non-Invasive Vascular Imaging  Vein Mapping  ([id number] ):   Left cephalic .0.37 -0.44 AC to upper arm  Outside Studies/Documentation  outside documents were reviewed including: Labs and office notes.  Medical Decision Making  Nathaniel Martinez is a 79 y.o. male who presents with  ESRD requiring hemodialysis.   Based on vein mapping and examination, this patient's permanent access options include: Left cephalic vein  AV fistula verse graft creation 04/17/2013 by Dr. Scot Dock.  Theda Sers, EMMA [facility] PA-C [facility] [place] Office: [phone]   04/11/2013, 1:05 PM  Agree with above. He appears to be a good candidate for a left arm AV fistula. I have explained the indications for placement of an AV fistula or AV graft. I've explained that if at all possible we will place an AV fistula.  I have reviewed the risks of placement of an AV fistula including but not limited to: failure of the fistula to mature, need for subsequent interventions, and thrombosis. In addition I have reviewed the potential complications of placement of an AV graft. These risks include, but are not limited to, graft thrombosis, graft infection, wound healing problems, bleeding, arm swelling, and  steal syndrome. All the patient's questions were answered and they are agreeable to proceed with surgery.  Deitra Mayo, MD, [place] [phone] 04/11/2013

## 2013-04-18 MED FILL — Mupirocin Oint 2%: CUTANEOUS | Qty: 22 | Status: AC

## 2013-04-19 ENCOUNTER — Encounter (HOSPITAL_COMMUNITY): Payer: Self-pay | Admitting: Vascular Surgery

## 2013-05-29 ENCOUNTER — Encounter: Payer: Self-pay | Admitting: Vascular Surgery

## 2013-05-30 ENCOUNTER — Other Ambulatory Visit: Payer: Self-pay | Admitting: *Deleted

## 2013-05-30 ENCOUNTER — Ambulatory Visit: Payer: Medicare Other | Admitting: Vascular Surgery

## 2013-05-30 ENCOUNTER — Encounter: Payer: Self-pay | Admitting: Vascular Surgery

## 2013-05-30 ENCOUNTER — Encounter (INDEPENDENT_AMBULATORY_CARE_PROVIDER_SITE_OTHER): Payer: Medicare Other | Admitting: *Deleted

## 2013-05-30 ENCOUNTER — Encounter (HOSPITAL_COMMUNITY): Payer: Self-pay | Admitting: Pharmacy Technician

## 2013-05-30 ENCOUNTER — Encounter: Payer: Self-pay | Admitting: *Deleted

## 2013-05-30 ENCOUNTER — Ambulatory Visit (INDEPENDENT_AMBULATORY_CARE_PROVIDER_SITE_OTHER): Payer: Medicare Other | Admitting: Vascular Surgery

## 2013-05-30 VITALS — BP 156/70 | HR 83 | Resp 16 | Ht 72.0 in | Wt 198.0 lb

## 2013-05-30 DIAGNOSIS — N186 End stage renal disease: Secondary | ICD-10-CM

## 2013-05-30 DIAGNOSIS — T82598A Other mechanical complication of other cardiac and vascular devices and implants, initial encounter: Secondary | ICD-10-CM

## 2013-05-30 DIAGNOSIS — Z4931 Encounter for adequacy testing for hemodialysis: Secondary | ICD-10-CM

## 2013-05-30 DIAGNOSIS — Z48812 Encounter for surgical aftercare following surgery on the circulatory system: Secondary | ICD-10-CM

## 2013-05-30 NOTE — Progress Notes (Signed)
Patient name: Nathaniel Martinez MRN: [id number] DOB: [birth year] Sex: male  REASON FOR VISIT: follow up after left brachiocephalic AV fistula.  HPI: Nathaniel Martinez is a 79 y.o. male who is not yet on dialysis. He had a left brachiocephalic AV fistula placed on 04/17/2013. He comes in for a routine follow up visit. He denies any recent uremic symptoms. He's had no pain or paresthesias in the left arm.   REVIEW OF SYSTEMS: Valu.Nieves ] denotes positive finding; [  ] denotes negative finding  CARDIOVASCULAR:  [ ]  chest pain   [ ]  dyspnea on exertion    CONSTITUTIONAL:  [ ]  fever   [ ]  chills  PHYSICAL EXAM: Filed Vitals:   05/30/13 1040  BP: 156/70  Pulse: 83  Resp: 16  Height: 6' (1.829 m)  Weight: 198 lb (89.812 kg)  SpO2: 100%   Body mass index is 26.85 kg/(m^2). GENERAL: The patient is a well-nourished male, in no acute distress. The vital signs are documented above. CARDIOVASCULAR: There is a regular rate and rhythm  PULMONARY: There is good air exchange bilaterally without wheezing or rales. His fistula has a palpable thrill. It is somewhat pulsatile proximally.  I have reviewed his duplex of his fistula which shows a diameters range from 0.36 to 0.67 cm. The depth of the fistula appears to be reasonable. There is an area of stenosis in the central portion of the fistula which may explain why the proximal fistula is somewhat pulsatile.  MEDICAL ISSUES: I have recommended we proceed with a fistulogram and possible venoplasty of the stenosis with limited contrast. Discussed the procedure potential complications and he is agreeable to proceed. This is scheduled for 06/11/2013.  [place] [facility] [place]: [phone]

## 2013-06-11 ENCOUNTER — Telehealth: Payer: Self-pay | Admitting: Vascular Surgery

## 2013-06-11 ENCOUNTER — Ambulatory Visit (HOSPITAL_COMMUNITY)
Admission: RE | Admit: 2013-06-11 | Discharge: 2013-06-11 | Disposition: A | Discharge status: Home / Self Care | Payer: Medicare Other | Source: Ambulatory Visit | Attending: Vascular Surgery | Admitting: Vascular Surgery

## 2013-06-11 ENCOUNTER — Encounter (HOSPITAL_COMMUNITY)
Admission: RE | Disposition: A | Discharge status: Home / Self Care | Payer: Self-pay | Source: Ambulatory Visit | Attending: Vascular Surgery

## 2013-06-11 DIAGNOSIS — I871 Compression of vein: Secondary | ICD-10-CM | POA: Insufficient documentation

## 2013-06-11 DIAGNOSIS — Y929 Unspecified place or not applicable: Secondary | ICD-10-CM | POA: Insufficient documentation

## 2013-06-11 DIAGNOSIS — Y832 Surgical operation with anastomosis, bypass or graft as the cause of abnormal reaction of the patient, or of later complication, without mention of misadventure at the time of the procedure: Secondary | ICD-10-CM | POA: Insufficient documentation

## 2013-06-11 DIAGNOSIS — T82898A Other specified complication of vascular prosthetic devices, implants and grafts, initial encounter: Secondary | ICD-10-CM | POA: Insufficient documentation

## 2013-06-11 DIAGNOSIS — N186 End stage renal disease: Secondary | ICD-10-CM

## 2013-06-11 HISTORY — PX: FISTULOGRAM: SHX5832

## 2013-06-11 LAB — POCT I-STAT, CHEM 8
Creatinine, Ser: 3 mg/dL — ABNORMAL HIGH (ref 0.50–1.35)
Glucose, Bld: 102 mg/dL — ABNORMAL HIGH (ref 70–99)
HCT: 27 % — ABNORMAL LOW (ref 39.0–52.0)
Hemoglobin: 9.2 g/dL — ABNORMAL LOW (ref 13.0–17.0)
Potassium: 4.2 mEq/L (ref 3.5–5.1)
Sodium: 139 mEq/L (ref 135–145)
TCO2: 23 mmol/L (ref 0–100)

## 2013-06-11 SURGERY — FISTULOGRAM
Anesthesia: LOCAL | Laterality: Left

## 2013-06-11 MED ORDER — HEPARIN (PORCINE) IN NACL 2-0.9 UNIT/ML-% IJ SOLN
INTRAMUSCULAR | Status: AC
  Filled 2013-06-11: qty 500

## 2013-06-11 MED ORDER — LABETALOL HCL 5 MG/ML IV SOLN: 10.0000 mg | INTRAVENOUS | Status: DC | PRN

## 2013-06-11 MED ORDER — HEPARIN SODIUM (PORCINE) 1000 UNIT/ML IJ SOLN
INTRAMUSCULAR | Status: AC
  Filled 2013-06-11: qty 1

## 2013-06-11 MED ORDER — LIDOCAINE HCL (PF) 1 % IJ SOLN
INTRAMUSCULAR | Status: AC
  Filled 2013-06-11: qty 30

## 2013-06-11 MED ORDER — SODIUM CHLORIDE 0.9 % IJ SOLN: 3.0000 mL | INTRAMUSCULAR | Status: DC | PRN

## 2013-06-11 NOTE — Telephone Encounter (Addendum)
Message copied by Doristine Section on Mon Jun 11, 2013 10:41 AM ------      Message from: Alfonso Patten      Created: Mon Jun 11, 2013 10:15 AM      Regarding: FW: charge and f/u                   ----- Message -----         From: Angelia Mould, MD         Sent: 06/11/2013   9:29 AM           To: Patrici Ranks, Alfonso Patten, RN, #      Subject: charge and f/u                                           PROCEDURE:       1. Ultrasound-guided access to left brachiocephalic AV fistula      2. fistulogram left brachiocephalic AV fistula      3. venoplasty of cephalic vein stenosis            SURGEON: Judeth Cornfield. Scot Dock, MD, FACS            He'll need a follow up visit in 6 weeks. He does not need a duplex at that time. Thank you.CD ------  notified patinet of fu appt. on 07-25-13 at 11:00

## 2013-06-11 NOTE — Op Note (Signed)
PATIENT: Nathaniel Martinez   MRN: [id number] DOB: June 14, 1933    DATE OF PROCEDURE: 06/11/2013  INDICATIONS: Kaymen Suppes is a 79 y.o. male who underwent a left brachiocephalic AV fistula. On follow up duplex he had evidence of some outflow stenosis in the fistula was somewhat pulsatile. He is not on dialysis. He comes in for fistulogram and possible venoplasty.  PROCEDURE:  1. Ultrasound-guided access to left brachiocephalic AV fistula 2. fistulogram left brachiocephalic AV fistula 3. venoplasty of cephalic vein stenosis  SURGEON: Judeth Cornfield. Scot Dock, MD, FACS  ANESTHESIA: local   EBL: minimal  Contrast used 25 cc  TECHNIQUE: The patient was brought to the peripheral vascular lab. The left upper extremity was prepped and draped in the usual sterile fashion. A timeout was performed. Under ultrasound guidance, after the skin was anesthetized, the left brachiocephalic fistula was cannulated. The micropuncture sheath was introduced over the wire. A fistulogram was obtained which demonstrated stenosis of approximately 123XX123 at the cephalic vein proximally just before entering the subclavian vein. I elected to address this was venoplasty. The micropuncture sheath was exchanged for a 5 [place] sheath. This received 3000 units of IV heparin. A 6 mm x 4 cm balloon was positioned across the stenosis at the proximal cephalic vein and inflated to 10 atmospheres for 1 minute. Completion films showed some residual stenosis. I therefore went back with a 7 mm x 4 cm balloon which was inflated to 10 atmospheres for 1 minute. There was some subtle weblike stenoses noted proximal to the stenosis and these were addressed with the 7 mm balloon which was inflated to 6 atmospheres. Completion film showed an excellent result. The fistula was no longer pulsatile.  FINDINGS:  1. Patent left brachiocephalic AV fistula 2. 123XX123 stenosis of proximal cephalic vein as it entered the subclavian vein which was successfully  addressed as discussed above. 3. One large competing branches in the mid upper arm.  Deitra Mayo, MD, FACS [facility]  DATE OF DICTATION:   06/11/2013

## 2013-06-11 NOTE — H&P (View-Only) (Signed)
Patient name: [name] MRN: [id number] DOB: [birth year] Sex: male  REASON FOR VISIT: follow up after left brachiocephalic AV fistula.  HPI: Damoni Wheelus is a 79 y.o. male who is not yet on dialysis. He had a left brachiocephalic AV fistula placed on 04/17/2013. He comes in for a routine follow up visit. He denies any recent uremic symptoms. He's had no pain or paresthesias in the left arm.   REVIEW OF SYSTEMS: Valu.Nieves ] denotes positive finding; [  ] denotes negative finding  CARDIOVASCULAR:  [ ]  chest pain   [ ]  dyspnea on exertion    CONSTITUTIONAL:  [ ]  fever   [ ]  chills  PHYSICAL EXAM: Filed Vitals:   05/30/13 1040  BP: 156/70  Pulse: 83  Resp: 16  Height: 6' (1.829 m)  Weight: 198 lb (89.812 kg)  SpO2: 100%   Body mass index is 26.85 kg/(m^2). GENERAL: The patient is a well-nourished male, in no acute distress. The vital signs are documented above. CARDIOVASCULAR: There is a regular rate and rhythm  PULMONARY: There is good air exchange bilaterally without wheezing or rales. His fistula has a palpable thrill. It is somewhat pulsatile proximally.  I have reviewed his duplex of his fistula which shows a diameters range from 0.36 to 0.67 cm. The depth of the fistula appears to be reasonable. There is an area of stenosis in the central portion of the fistula which may explain why the proximal fistula is somewhat pulsatile.  MEDICAL ISSUES: I have recommended we proceed with a fistulogram and possible venoplasty of the stenosis with limited contrast. Discussed the procedure potential complications and he is agreeable to proceed. This is scheduled for 06/11/2013.  [facility] [place] Beeper: [phone]

## 2013-06-11 NOTE — Interval H&P Note (Signed)
History and Physical Interval Note:  06/11/2013 8:27 AM  Nathaniel Martinez  has presented today for surgery, with the diagnosis of instage renal  The various methods of treatment have been discussed with the patient and family. After consideration of risks, benefits and other options for treatment, the patient has consented to  Procedure(s): FISTULOGRAM (Left) as a surgical intervention .  The patient's history has been reviewed, patient examined, no change in status, stable for surgery.  I have reviewed the patient's chart and labs.  Questions were answered to the patient's satisfaction.     [name]

## 2013-07-24 ENCOUNTER — Encounter: Payer: Self-pay | Admitting: Vascular Surgery

## 2013-07-25 ENCOUNTER — Encounter: Payer: Self-pay | Admitting: Vascular Surgery

## 2013-07-25 ENCOUNTER — Ambulatory Visit (INDEPENDENT_AMBULATORY_CARE_PROVIDER_SITE_OTHER): Payer: Self-pay | Admitting: Vascular Surgery

## 2013-07-25 VITALS — BP 162/74 | HR 91 | Temp 98.2°F | Resp 18 | Ht 72.0 in | Wt 198.0 lb

## 2013-07-25 DIAGNOSIS — N186 End stage renal disease: Secondary | ICD-10-CM

## 2013-07-25 DIAGNOSIS — Z48812 Encounter for surgical aftercare following surgery on the circulatory system: Secondary | ICD-10-CM

## 2013-07-25 NOTE — Assessment & Plan Note (Signed)
His left upper arm AV fistula appears to have matured nicely. I think this would be adequate for dialysis if it were needed. He does have one significant competing branches the upper arm and if he had any problems with the fistula certainly we could ligate this. However at this point I do not think this is necessary. In addition we have performed venoplasty of some outflow stenosis of the cephalic vein on the left and he is at risk for recurrent stenosis here. If he has problems with the fistula in the future we could consider follow up fistulogram and venoplasty. I will see him back as needed.

## 2013-07-25 NOTE — Progress Notes (Signed)
Patient name: Nathaniel Martinez MRN: [id number] DOB: April 13, 1933 Sex: male  REASON FOR VISIT: follow up of left brachiocephalic AV fistula appeared  HPI: Nathaniel Martinez is a 79 y.o. male who is not yet on dialysis. He had a left brachiocephalic AV fistula placed on 04/17/2013. He had a fistulogram on 06/11/2013 as there was some evidence of outflow stenosis. He was found to have a cephalic vein stenosis where the cephalic vein into the subclavian vein and this was successfully ballooned. Comes in for follow up visit. He has no specific complaints.  REVIEW OF SYSTEMS: Valu.Nieves ] denotes positive finding; [  ] denotes negative finding  CARDIOVASCULAR:  [ ]  chest pain   [ ]  dyspnea on exertion    CONSTITUTIONAL:  [ ]  fever   [ ]  chills  PHYSICAL EXAM: Filed Vitals:   07/25/13 1043  BP: 162/74  Pulse: 91  Temp: 98.2 F (36.8 C)  TempSrc: Oral  Resp: 18  Height: 6' (1.829 m)  Weight: 198 lb (89.812 kg)  SpO2: 100%   Body mass index is 26.85 kg/(m^2). GENERAL: The patient is a well-nourished male, in no acute distress. The vital signs are documented above. CARDIOVASCULAR: There is a regular rate and rhythm  PULMONARY: There is good air exchange bilaterally without wheezing or rales. This fistula has an excellent thrill. He does have a competing branches the mid upper arm.  Duplex scan shows that the fistula has good diameters in the depths. He does have a competing branches which is noted in the mid upper arm.  MEDICAL ISSUES:  End stage renal disease His left upper arm AV fistula appears to have matured nicely. I think this would be adequate for dialysis if it were needed. He does have one significant competing branches the upper arm and if he had any problems with the fistula certainly we could ligate this. However at this point I do not think this is necessary. In addition we have performed venoplasty of some outflow stenosis of the cephalic vein on the left and he is at risk for recurrent  stenosis here. If he has problems with the fistula in the future we could consider follow up fistulogram and venoplasty. I will see him back as needed.   [facility] [place] Beeper: [phone]

## 2014-08-13 ENCOUNTER — Encounter (INDEPENDENT_AMBULATORY_CARE_PROVIDER_SITE_OTHER): Payer: Medicare Other | Admitting: Ophthalmology

## 2014-08-13 DIAGNOSIS — E11329 Type 2 diabetes mellitus with mild nonproliferative diabetic retinopathy without macular edema: Secondary | ICD-10-CM

## 2014-08-13 DIAGNOSIS — E11331 Type 2 diabetes mellitus with moderate nonproliferative diabetic retinopathy with macular edema: Secondary | ICD-10-CM | POA: Diagnosis not present

## 2014-08-13 DIAGNOSIS — H43813 Vitreous degeneration, bilateral: Secondary | ICD-10-CM | POA: Diagnosis not present

## 2014-08-13 DIAGNOSIS — E11311 Type 2 diabetes mellitus with unspecified diabetic retinopathy with macular edema: Secondary | ICD-10-CM | POA: Diagnosis not present

## 2014-09-25 ENCOUNTER — Encounter (INDEPENDENT_AMBULATORY_CARE_PROVIDER_SITE_OTHER): Payer: Medicare Other | Admitting: Ophthalmology

## 2014-09-25 DIAGNOSIS — E10329 Type 1 diabetes mellitus with mild nonproliferative diabetic retinopathy without macular edema: Secondary | ICD-10-CM | POA: Diagnosis not present

## 2014-09-25 DIAGNOSIS — H43813 Vitreous degeneration, bilateral: Secondary | ICD-10-CM | POA: Diagnosis not present

## 2014-09-25 DIAGNOSIS — E11311 Type 2 diabetes mellitus with unspecified diabetic retinopathy with macular edema: Secondary | ICD-10-CM

## 2014-09-25 DIAGNOSIS — E11331 Type 2 diabetes mellitus with moderate nonproliferative diabetic retinopathy with macular edema: Secondary | ICD-10-CM

## 2014-10-10 ENCOUNTER — Encounter (HOSPITAL_COMMUNITY): Payer: Self-pay | Admitting: Vascular Surgery

## 2014-10-23 ENCOUNTER — Encounter (INDEPENDENT_AMBULATORY_CARE_PROVIDER_SITE_OTHER): Payer: Medicare Other | Admitting: Ophthalmology

## 2014-10-23 DIAGNOSIS — H43813 Vitreous degeneration, bilateral: Secondary | ICD-10-CM | POA: Diagnosis not present

## 2014-10-23 DIAGNOSIS — E11331 Type 2 diabetes mellitus with moderate nonproliferative diabetic retinopathy with macular edema: Secondary | ICD-10-CM

## 2014-10-23 DIAGNOSIS — E11329 Type 2 diabetes mellitus with mild nonproliferative diabetic retinopathy without macular edema: Secondary | ICD-10-CM | POA: Diagnosis not present

## 2014-10-23 DIAGNOSIS — E11311 Type 2 diabetes mellitus with unspecified diabetic retinopathy with macular edema: Secondary | ICD-10-CM

## 2014-12-04 ENCOUNTER — Encounter (INDEPENDENT_AMBULATORY_CARE_PROVIDER_SITE_OTHER): Payer: PPO | Admitting: Ophthalmology

## 2014-12-04 DIAGNOSIS — E11311 Type 2 diabetes mellitus with unspecified diabetic retinopathy with macular edema: Secondary | ICD-10-CM

## 2014-12-04 DIAGNOSIS — E11329 Type 2 diabetes mellitus with mild nonproliferative diabetic retinopathy without macular edema: Secondary | ICD-10-CM

## 2014-12-04 DIAGNOSIS — H43813 Vitreous degeneration, bilateral: Secondary | ICD-10-CM

## 2014-12-04 DIAGNOSIS — E11331 Type 2 diabetes mellitus with moderate nonproliferative diabetic retinopathy with macular edema: Secondary | ICD-10-CM

## 2015-01-15 ENCOUNTER — Encounter (INDEPENDENT_AMBULATORY_CARE_PROVIDER_SITE_OTHER): Payer: PPO | Admitting: Ophthalmology

## 2015-01-15 DIAGNOSIS — E11311 Type 2 diabetes mellitus with unspecified diabetic retinopathy with macular edema: Secondary | ICD-10-CM | POA: Diagnosis not present

## 2015-01-15 DIAGNOSIS — E11321 Type 2 diabetes mellitus with mild nonproliferative diabetic retinopathy with macular edema: Secondary | ICD-10-CM

## 2015-01-15 DIAGNOSIS — E11329 Type 2 diabetes mellitus with mild nonproliferative diabetic retinopathy without macular edema: Secondary | ICD-10-CM

## 2015-01-15 DIAGNOSIS — H43813 Vitreous degeneration, bilateral: Secondary | ICD-10-CM | POA: Diagnosis not present

## 2015-01-23 ENCOUNTER — Encounter (HOSPITAL_COMMUNITY)
Admission: RE | Admit: 2015-01-23 | Discharge: 2015-01-23 | Disposition: A | Discharge status: Home / Self Care | Payer: PPO | Source: Ambulatory Visit | Attending: Nephrology | Admitting: Nephrology

## 2015-01-23 ENCOUNTER — Encounter (HOSPITAL_COMMUNITY): Payer: Self-pay

## 2015-01-23 DIAGNOSIS — Z01818 Encounter for other preprocedural examination: Secondary | ICD-10-CM | POA: Insufficient documentation

## 2015-01-23 HISTORY — DX: Other specified disorders of the skin and subcutaneous tissue: L98.8

## 2015-01-23 LAB — HEMOGLOBIN AND HEMATOCRIT, BLOOD
HEMATOCRIT: 26.5 % — AB (ref 39.0–52.0)
HEMOGLOBIN: 9.1 g/dL — AB (ref 13.0–17.0)

## 2015-01-23 MED ORDER — EPOETIN ALFA 4000 UNIT/ML IJ SOLN
INTRAMUSCULAR | Status: AC
  Filled 2015-01-23: qty 1

## 2015-01-23 MED ORDER — EPOETIN ALFA 4000 UNIT/ML IJ SOLN
3000.0000 [IU] | INTRAMUSCULAR | Status: DC
Start: 2015-01-23 — End: 2015-01-24
  Administered 2015-01-23: 3000 [IU] via SUBCUTANEOUS

## 2015-01-23 NOTE — Progress Notes (Signed)
Results for Nathaniel Martinez, Nathaniel Martinez (MRN [id number]) as of 01/23/2015 12:53 Labs prior to prescribed  Procrit 3000 units SQ given, tolerated well. Appointment made 02/06/2015   Ref. Range 01/23/2015 09:50  Hemoglobin Latest Range: 13.0-17.0 g/dL 9.1 (L)  HCT Latest Range: 39.0-52.0 % 26.5 (L)

## 2015-01-23 NOTE — Discharge Instructions (Signed)

## 2015-02-06 ENCOUNTER — Encounter (HOSPITAL_COMMUNITY)
Admission: RE | Admit: 2015-02-06 | Discharge: 2015-02-06 | Disposition: A | Discharge status: Home / Self Care | Payer: PPO | Source: Ambulatory Visit | Attending: Nephrology | Admitting: Nephrology

## 2015-02-06 ENCOUNTER — Encounter (HOSPITAL_COMMUNITY): Payer: Self-pay

## 2015-02-06 DIAGNOSIS — D509 Iron deficiency anemia, unspecified: Secondary | ICD-10-CM | POA: Diagnosis present

## 2015-02-06 LAB — HEMOGLOBIN AND HEMATOCRIT, BLOOD
HEMATOCRIT: 26.4 % — AB (ref 39.0–52.0)
HEMOGLOBIN: 9.1 g/dL — AB (ref 13.0–17.0)

## 2015-02-06 MED ORDER — EPOETIN ALFA 10000 UNIT/ML IJ SOLN
3000.0000 [IU] | INTRAMUSCULAR | Status: DC
  Administered 2015-02-06: 3000 [IU] via SUBCUTANEOUS
  Filled 2015-02-06: qty 1

## 2015-02-06 NOTE — Progress Notes (Signed)
Results for ASLAN, BURDITT (MRN [id number]) as of 02/06/2015 10:58  Ref. Range 02/06/2015 10:07  Hemoglobin Latest Range: 13.0-17.0 g/dL 9.1 (L)  HCT Latest Range: 39.0-52.0 % 26.4 (L)

## 2015-02-20 ENCOUNTER — Encounter (HOSPITAL_COMMUNITY): Payer: Self-pay

## 2015-02-20 ENCOUNTER — Encounter (HOSPITAL_COMMUNITY)
Admission: RE | Admit: 2015-02-20 | Discharge: 2015-02-20 | Disposition: A | Discharge status: Home / Self Care | Payer: PPO | Source: Ambulatory Visit | Attending: Nephrology | Admitting: Nephrology

## 2015-02-20 DIAGNOSIS — D509 Iron deficiency anemia, unspecified: Secondary | ICD-10-CM | POA: Diagnosis not present

## 2015-02-20 LAB — HEMOGLOBIN AND HEMATOCRIT, BLOOD
HEMATOCRIT: 27.4 % — AB (ref 39.0–52.0)
HEMOGLOBIN: 9.3 g/dL — AB (ref 13.0–17.0)

## 2015-02-20 MED ORDER — EPOETIN ALFA 4000 UNIT/ML IJ SOLN
3000.0000 [IU] | Freq: Once | INTRAMUSCULAR | Status: AC
  Administered 2015-02-20: 3000 [IU] via SUBCUTANEOUS
  Filled 2015-02-20: qty 1

## 2015-02-20 NOTE — Progress Notes (Signed)
Results for Nathaniel Martinez, Nathaniel Martinez (MRN [id number]) as of 02/20/2015 14:06  Ref. Range 02/20/2015 09:55  Hemoglobin Latest Ref Range: 13.0-17.0 g/dL 9.3 (L)  HCT Latest Ref Range: 39.0-52.0 % 27.4 (L)

## 2015-02-26 ENCOUNTER — Encounter (INDEPENDENT_AMBULATORY_CARE_PROVIDER_SITE_OTHER): Payer: PPO | Admitting: Ophthalmology

## 2015-02-26 DIAGNOSIS — H43813 Vitreous degeneration, bilateral: Secondary | ICD-10-CM

## 2015-02-26 DIAGNOSIS — E11329 Type 2 diabetes mellitus with mild nonproliferative diabetic retinopathy without macular edema: Secondary | ICD-10-CM

## 2015-02-26 DIAGNOSIS — E11321 Type 2 diabetes mellitus with mild nonproliferative diabetic retinopathy with macular edema: Secondary | ICD-10-CM

## 2015-02-26 DIAGNOSIS — E11311 Type 2 diabetes mellitus with unspecified diabetic retinopathy with macular edema: Secondary | ICD-10-CM | POA: Diagnosis not present

## 2015-03-06 ENCOUNTER — Encounter (HOSPITAL_COMMUNITY)
Admission: RE | Admit: 2015-03-06 | Discharge: 2015-03-06 | Disposition: A | Discharge status: Home / Self Care | Payer: PPO | Source: Ambulatory Visit | Attending: Nephrology | Admitting: Nephrology

## 2015-03-06 DIAGNOSIS — D509 Iron deficiency anemia, unspecified: Secondary | ICD-10-CM | POA: Diagnosis not present

## 2015-03-06 LAB — HEMOGLOBIN AND HEMATOCRIT, BLOOD
HCT: 29.8 % — ABNORMAL LOW (ref 39.0–52.0)
Hemoglobin: 10.1 g/dL — ABNORMAL LOW (ref 13.0–17.0)

## 2015-03-20 ENCOUNTER — Encounter (HOSPITAL_COMMUNITY)
Admission: RE | Admit: 2015-03-20 | Discharge: 2015-03-20 | Disposition: A | Discharge status: Home / Self Care | Payer: PPO | Source: Ambulatory Visit | Attending: Nephrology | Admitting: Nephrology

## 2015-03-20 DIAGNOSIS — D509 Iron deficiency anemia, unspecified: Secondary | ICD-10-CM | POA: Diagnosis not present

## 2015-03-20 LAB — HEMOGLOBIN AND HEMATOCRIT, BLOOD
HEMATOCRIT: 28.2 % — AB (ref 39.0–52.0)
HEMOGLOBIN: 9.4 g/dL — AB (ref 13.0–17.0)

## 2015-03-20 MED ORDER — EPOETIN ALFA 4000 UNIT/ML IJ SOLN
INTRAMUSCULAR | Status: AC
  Filled 2015-03-20: qty 1

## 2015-03-20 MED ORDER — EPOETIN ALFA 10000 UNIT/ML IJ SOLN
INTRAMUSCULAR | Status: AC
  Filled 2015-03-20: qty 1

## 2015-03-20 MED ORDER — EPOETIN ALFA 10000 UNIT/ML IJ SOLN
3000.0000 [IU] | INTRAMUSCULAR | Status: DC
  Administered 2015-03-20: 3000 [IU] via SUBCUTANEOUS

## 2015-03-20 MED ORDER — EPOETIN ALFA 4000 UNIT/ML IJ SOLN
3000.0000 [IU] | INTRAMUSCULAR | Status: DC
  Filled 2015-03-20: qty 1

## 2015-03-26 ENCOUNTER — Encounter (INDEPENDENT_AMBULATORY_CARE_PROVIDER_SITE_OTHER): Payer: PPO | Admitting: Ophthalmology

## 2015-03-26 DIAGNOSIS — E11321 Type 2 diabetes mellitus with mild nonproliferative diabetic retinopathy with macular edema: Secondary | ICD-10-CM | POA: Diagnosis not present

## 2015-03-26 DIAGNOSIS — E11311 Type 2 diabetes mellitus with unspecified diabetic retinopathy with macular edema: Secondary | ICD-10-CM

## 2015-03-26 DIAGNOSIS — H43813 Vitreous degeneration, bilateral: Secondary | ICD-10-CM | POA: Diagnosis not present

## 2015-03-26 DIAGNOSIS — E11329 Type 2 diabetes mellitus with mild nonproliferative diabetic retinopathy without macular edema: Secondary | ICD-10-CM

## 2015-04-03 ENCOUNTER — Encounter (HOSPITAL_COMMUNITY)
Admission: RE | Admit: 2015-04-03 | Discharge: 2015-04-03 | Disposition: A | Discharge status: Home / Self Care | Payer: PPO | Source: Ambulatory Visit | Attending: Nephrology | Admitting: Nephrology

## 2015-04-03 DIAGNOSIS — D509 Iron deficiency anemia, unspecified: Secondary | ICD-10-CM | POA: Diagnosis not present

## 2015-04-03 LAB — HEMOGLOBIN AND HEMATOCRIT, BLOOD
HEMATOCRIT: 28.7 % — AB (ref 39.0–52.0)
Hemoglobin: 9.8 g/dL — ABNORMAL LOW (ref 13.0–17.0)

## 2015-04-03 MED ORDER — EPOETIN ALFA 4000 UNIT/ML IJ SOLN
INTRAMUSCULAR | Status: AC
  Filled 2015-04-03: qty 1

## 2015-04-03 MED ORDER — EPOETIN ALFA 10000 UNIT/ML IJ SOLN
3000.0000 [IU] | INTRAMUSCULAR | Status: DC
  Administered 2015-04-03: 3000 [IU] via SUBCUTANEOUS

## 2015-04-03 NOTE — Progress Notes (Signed)
Results for TRYSTEN, PRESUTTI (MRN [id number]) as of 04/03/2015 09:44  Ref. Range 04/03/2015 09:15  Hemoglobin Latest Ref Range: 13.0-17.0 g/dL 9.8 (L)  HCT Latest Ref Range: 39.0-52.0 % 28.7 (L)

## 2015-04-17 ENCOUNTER — Encounter (HOSPITAL_COMMUNITY)
Admission: RE | Admit: 2015-04-17 | Discharge: 2015-04-17 | Disposition: A | Discharge status: Home / Self Care | Payer: PPO | Source: Ambulatory Visit | Attending: Nephrology | Admitting: Nephrology

## 2015-04-17 ENCOUNTER — Other Ambulatory Visit (HOSPITAL_COMMUNITY): Payer: PPO

## 2015-04-17 DIAGNOSIS — D509 Iron deficiency anemia, unspecified: Secondary | ICD-10-CM | POA: Diagnosis not present

## 2015-04-17 LAB — HEMOGLOBIN AND HEMATOCRIT, BLOOD
HEMATOCRIT: 30.2 % — AB (ref 39.0–52.0)
HEMOGLOBIN: 10.1 g/dL — AB (ref 13.0–17.0)

## 2015-04-17 NOTE — Progress Notes (Signed)
Results for EVANGELOS, DU (MRN [id number]) as of 04/17/2015 10:12  Ref. Range 04/17/2015 10:00  Hemoglobin Latest Ref Range: 13.0-17.0 g/dL 10.1 (L)  HCT Latest Ref Range: 39.0-52.0 % 30.2 (L)

## 2015-04-30 ENCOUNTER — Encounter (INDEPENDENT_AMBULATORY_CARE_PROVIDER_SITE_OTHER): Payer: PPO | Admitting: Ophthalmology

## 2015-04-30 DIAGNOSIS — E11321 Type 2 diabetes mellitus with mild nonproliferative diabetic retinopathy with macular edema: Secondary | ICD-10-CM | POA: Diagnosis not present

## 2015-04-30 DIAGNOSIS — E11311 Type 2 diabetes mellitus with unspecified diabetic retinopathy with macular edema: Secondary | ICD-10-CM | POA: Diagnosis not present

## 2015-04-30 DIAGNOSIS — E11329 Type 2 diabetes mellitus with mild nonproliferative diabetic retinopathy without macular edema: Secondary | ICD-10-CM | POA: Diagnosis not present

## 2015-04-30 DIAGNOSIS — H43813 Vitreous degeneration, bilateral: Secondary | ICD-10-CM

## 2015-05-01 ENCOUNTER — Encounter (HOSPITAL_COMMUNITY): Admission: RE | Admit: 2015-05-01 | Payer: PPO | Source: Ambulatory Visit

## 2015-05-02 ENCOUNTER — Encounter (HOSPITAL_COMMUNITY): Payer: Self-pay

## 2015-05-02 ENCOUNTER — Encounter (HOSPITAL_COMMUNITY)
Admission: RE | Admit: 2015-05-02 | Discharge: 2015-05-02 | Disposition: A | Discharge status: Home / Self Care | Payer: PPO | Source: Ambulatory Visit | Attending: Nephrology | Admitting: Nephrology

## 2015-05-02 DIAGNOSIS — D631 Anemia in chronic kidney disease: Secondary | ICD-10-CM | POA: Insufficient documentation

## 2015-05-02 DIAGNOSIS — Z79899 Other long term (current) drug therapy: Secondary | ICD-10-CM | POA: Insufficient documentation

## 2015-05-02 DIAGNOSIS — Z5181 Encounter for therapeutic drug level monitoring: Secondary | ICD-10-CM | POA: Diagnosis not present

## 2015-05-02 DIAGNOSIS — N185 Chronic kidney disease, stage 5: Secondary | ICD-10-CM | POA: Insufficient documentation

## 2015-05-02 LAB — HEMOGLOBIN AND HEMATOCRIT, BLOOD
HCT: 28.4 % — ABNORMAL LOW (ref 39.0–52.0)
HEMOGLOBIN: 9.7 g/dL — AB (ref 13.0–17.0)

## 2015-05-02 MED ORDER — EPOETIN ALFA 4000 UNIT/ML IJ SOLN
INTRAMUSCULAR | Status: AC
  Filled 2015-05-02: qty 1

## 2015-05-02 MED ORDER — EPOETIN ALFA 4000 UNIT/ML IJ SOLN
3000.0000 [IU] | INTRAMUSCULAR | Status: DC
  Administered 2015-05-02: 3000 [IU] via SUBCUTANEOUS

## 2015-05-02 NOTE — Progress Notes (Signed)
Results for Nathaniel Martinez, Nathaniel Martinez (MRN [id number]) as of 05/02/2015 09:24 Labs today, received 3000 units Procrit as ordered. Next appointment 05/15/2015 @ 1000   Ref. Range 05/02/2015 08:45  Hemoglobin Latest Ref Range: 13.0-17.0 g/dL 9.7 (L)  HCT Latest Ref Range: 39.0-52.0 % 28.4 (L)

## 2015-05-15 ENCOUNTER — Encounter (HOSPITAL_COMMUNITY)
Admission: RE | Admit: 2015-05-15 | Discharge: 2015-05-15 | Disposition: A | Discharge status: Home / Self Care | Payer: PPO | Source: Ambulatory Visit | Attending: Nephrology | Admitting: Nephrology

## 2015-05-15 DIAGNOSIS — N185 Chronic kidney disease, stage 5: Secondary | ICD-10-CM | POA: Diagnosis not present

## 2015-05-15 LAB — HEMOGLOBIN AND HEMATOCRIT, BLOOD
HCT: 28.1 % — ABNORMAL LOW (ref 39.0–52.0)
Hemoglobin: 9.5 g/dL — ABNORMAL LOW (ref 13.0–17.0)

## 2015-05-15 MED ORDER — EPOETIN ALFA 4000 UNIT/ML IJ SOLN
INTRAMUSCULAR | Status: AC
  Filled 2015-05-15: qty 1

## 2015-05-15 MED ORDER — EPOETIN ALFA 4000 UNIT/ML IJ SOLN
3000.0000 [IU] | INTRAMUSCULAR | Status: DC
  Administered 2015-05-15: 3000 [IU] via SUBCUTANEOUS

## 2015-05-27 ENCOUNTER — Encounter (INDEPENDENT_AMBULATORY_CARE_PROVIDER_SITE_OTHER): Payer: PPO | Admitting: Ophthalmology

## 2015-05-27 DIAGNOSIS — H35033 Hypertensive retinopathy, bilateral: Secondary | ICD-10-CM

## 2015-05-27 DIAGNOSIS — H43813 Vitreous degeneration, bilateral: Secondary | ICD-10-CM | POA: Diagnosis not present

## 2015-05-27 DIAGNOSIS — E11321 Type 2 diabetes mellitus with mild nonproliferative diabetic retinopathy with macular edema: Secondary | ICD-10-CM

## 2015-05-27 DIAGNOSIS — E11311 Type 2 diabetes mellitus with unspecified diabetic retinopathy with macular edema: Secondary | ICD-10-CM | POA: Diagnosis not present

## 2015-05-27 DIAGNOSIS — I10 Essential (primary) hypertension: Secondary | ICD-10-CM

## 2015-05-27 DIAGNOSIS — E11329 Type 2 diabetes mellitus with mild nonproliferative diabetic retinopathy without macular edema: Secondary | ICD-10-CM | POA: Diagnosis not present

## 2015-05-29 ENCOUNTER — Encounter (HOSPITAL_COMMUNITY)
Admission: RE | Admit: 2015-05-29 | Discharge: 2015-05-29 | Disposition: A | Discharge status: Home / Self Care | Payer: PPO | Source: Ambulatory Visit | Attending: Nephrology | Admitting: Nephrology

## 2015-05-29 DIAGNOSIS — N185 Chronic kidney disease, stage 5: Secondary | ICD-10-CM | POA: Diagnosis not present

## 2015-05-29 LAB — HEMOGLOBIN AND HEMATOCRIT, BLOOD
HCT: 29.4 % — ABNORMAL LOW (ref 39.0–52.0)
HEMOGLOBIN: 10 g/dL — AB (ref 13.0–17.0)

## 2015-05-29 NOTE — Progress Notes (Signed)
Results for Nathaniel Martinez, Nathaniel Martinez (MRN [id number]) as of 05/29/2015 14:20  Ref. Range 05/29/2015 09:45  Hemoglobin Latest Ref Range: 13.0-17.0 g/dL 10.0 (L)  HCT Latest Ref Range: 39.0-52.0 % 29.4 (L)

## 2015-06-12 ENCOUNTER — Encounter (HOSPITAL_COMMUNITY): Admission: RE | Admit: 2015-06-12 | Payer: PPO | Source: Ambulatory Visit

## 2015-06-12 ENCOUNTER — Encounter (HOSPITAL_COMMUNITY)
Admission: RE | Admit: 2015-06-12 | Discharge: 2015-06-12 | Disposition: A | Discharge status: Home / Self Care | Payer: PPO | Source: Ambulatory Visit | Attending: Nephrology | Admitting: Nephrology

## 2015-06-12 DIAGNOSIS — N185 Chronic kidney disease, stage 5: Secondary | ICD-10-CM | POA: Diagnosis present

## 2015-06-12 DIAGNOSIS — D649 Anemia, unspecified: Secondary | ICD-10-CM | POA: Insufficient documentation

## 2015-06-12 LAB — HEMOGLOBIN AND HEMATOCRIT, BLOOD
HEMATOCRIT: 27.7 % — AB (ref 39.0–52.0)
HEMOGLOBIN: 9.4 g/dL — AB (ref 13.0–17.0)

## 2015-06-12 MED ORDER — EPOETIN ALFA 3000 UNIT/ML IJ SOLN: 3000.0000 [IU] | INTRAMUSCULAR | Status: DC

## 2015-06-12 MED ORDER — EPOETIN ALFA 4000 UNIT/ML IJ SOLN
3000.0000 [IU] | Freq: Once | INTRAMUSCULAR | Status: AC
  Administered 2015-06-12: 3000 [IU] via SUBCUTANEOUS

## 2015-06-12 MED ORDER — EPOETIN ALFA 4000 UNIT/ML IJ SOLN: 3000.0000 [IU] | INTRAMUSCULAR | Status: DC

## 2015-06-12 MED ORDER — EPOETIN ALFA 4000 UNIT/ML IJ SOLN
INTRAMUSCULAR | Status: AC
  Filled 2015-06-12: qty 1

## 2015-06-12 NOTE — Progress Notes (Signed)
Results for DARIC, PASLAY (MRN [id number]) as of 06/12/2015 14:17  Ref. Range 06/12/2015 09:44  Hemoglobin Latest Ref Range: 13.0-17.0 g/dL 9.4 (L)  HCT Latest Ref Range: 39.0-52.0 % 27.7 (L)

## 2015-06-26 ENCOUNTER — Encounter (HOSPITAL_COMMUNITY)
Admission: RE | Admit: 2015-06-26 | Discharge: 2015-06-26 | Disposition: A | Discharge status: Home / Self Care | Payer: PPO | Source: Ambulatory Visit | Attending: Nephrology | Admitting: Nephrology

## 2015-06-26 ENCOUNTER — Encounter (HOSPITAL_COMMUNITY): Payer: Self-pay

## 2015-06-26 DIAGNOSIS — N185 Chronic kidney disease, stage 5: Secondary | ICD-10-CM | POA: Diagnosis not present

## 2015-06-26 LAB — HEMOGLOBIN AND HEMATOCRIT, BLOOD
HCT: 28.8 % — ABNORMAL LOW (ref 39.0–52.0)
Hemoglobin: 10 g/dL — ABNORMAL LOW (ref 13.0–17.0)

## 2015-06-26 MED ORDER — EPOETIN ALFA 4000 UNIT/ML IJ SOLN
3000.0000 [IU] | INTRAMUSCULAR | Status: DC
  Filled 2015-06-26: qty 1

## 2015-06-26 NOTE — Progress Notes (Signed)
Results for HALBERT, REZNICK (MRN [id number]) as of 06/26/2015 12:08  Ref. Range 06/26/2015 09:30  Hemoglobin Latest Ref Range: 13.0-17.0 g/dL 10.0 (L)  HCT Latest Ref Range: 39.0-52.0 % 28.8 (L)

## 2015-07-02 ENCOUNTER — Encounter (INDEPENDENT_AMBULATORY_CARE_PROVIDER_SITE_OTHER): Payer: PPO | Admitting: Ophthalmology

## 2015-07-02 DIAGNOSIS — E11311 Type 2 diabetes mellitus with unspecified diabetic retinopathy with macular edema: Secondary | ICD-10-CM

## 2015-07-02 DIAGNOSIS — E11331 Type 2 diabetes mellitus with moderate nonproliferative diabetic retinopathy with macular edema: Secondary | ICD-10-CM | POA: Diagnosis not present

## 2015-07-02 DIAGNOSIS — H43813 Vitreous degeneration, bilateral: Secondary | ICD-10-CM

## 2015-07-02 DIAGNOSIS — E11329 Type 2 diabetes mellitus with mild nonproliferative diabetic retinopathy without macular edema: Secondary | ICD-10-CM

## 2015-07-10 ENCOUNTER — Encounter (HOSPITAL_COMMUNITY)
Admission: RE | Admit: 2015-07-10 | Discharge: 2015-07-10 | Disposition: A | Discharge status: Home / Self Care | Payer: PPO | Source: Ambulatory Visit | Attending: Nephrology | Admitting: Nephrology

## 2015-07-10 DIAGNOSIS — D631 Anemia in chronic kidney disease: Secondary | ICD-10-CM | POA: Insufficient documentation

## 2015-07-10 DIAGNOSIS — N185 Chronic kidney disease, stage 5: Secondary | ICD-10-CM | POA: Diagnosis present

## 2015-07-10 DIAGNOSIS — Z5181 Encounter for therapeutic drug level monitoring: Secondary | ICD-10-CM | POA: Diagnosis not present

## 2015-07-10 DIAGNOSIS — Z79899 Other long term (current) drug therapy: Secondary | ICD-10-CM | POA: Diagnosis not present

## 2015-07-10 LAB — HEMOGLOBIN AND HEMATOCRIT, BLOOD
HCT: 29.5 % — ABNORMAL LOW (ref 39.0–52.0)
Hemoglobin: 10 g/dL — ABNORMAL LOW (ref 13.0–17.0)

## 2015-07-10 NOTE — Progress Notes (Signed)
hgb 10.0 therefore procrit withheld. Results faxed to Dr Lowanda Foster.

## 2015-07-24 ENCOUNTER — Encounter (HOSPITAL_COMMUNITY): Payer: Self-pay

## 2015-07-24 ENCOUNTER — Encounter (HOSPITAL_COMMUNITY)
Admission: RE | Admit: 2015-07-24 | Discharge: 2015-07-24 | Disposition: A | Discharge status: Home / Self Care | Payer: PPO | Source: Ambulatory Visit | Attending: Nephrology | Admitting: Nephrology

## 2015-07-24 DIAGNOSIS — N185 Chronic kidney disease, stage 5: Secondary | ICD-10-CM | POA: Diagnosis not present

## 2015-07-24 LAB — HEMOGLOBIN AND HEMATOCRIT, BLOOD
HEMATOCRIT: 28.7 % — AB (ref 39.0–52.0)
HEMOGLOBIN: 9.7 g/dL — AB (ref 13.0–17.0)

## 2015-07-24 MED ORDER — EPOETIN ALFA 20000 UNIT/ML IJ SOLN
3000.0000 [IU] | INTRAMUSCULAR | Status: DC
  Administered 2015-07-24: 3000 [IU] via SUBCUTANEOUS
  Filled 2015-07-24: qty 1

## 2015-07-24 NOTE — Progress Notes (Signed)
Results for Nathaniel Martinez, Nathaniel Martinez (MRN [id number]) as of 07/24/2015 10:44  Ref. Range 07/24/2015 09:43  Hemoglobin Latest Ref Range: 13.0-17.0 g/dL 9.7 (L)  HCT Latest Ref Range: 39.0-52.0 % 28.7 (L)

## 2015-08-20 ENCOUNTER — Encounter (INDEPENDENT_AMBULATORY_CARE_PROVIDER_SITE_OTHER): Payer: PPO | Admitting: Ophthalmology

## 2015-08-20 DIAGNOSIS — H43813 Vitreous degeneration, bilateral: Secondary | ICD-10-CM

## 2015-08-20 DIAGNOSIS — E113211 Type 2 diabetes mellitus with mild nonproliferative diabetic retinopathy with macular edema, right eye: Secondary | ICD-10-CM | POA: Diagnosis not present

## 2015-08-20 DIAGNOSIS — E113292 Type 2 diabetes mellitus with mild nonproliferative diabetic retinopathy without macular edema, left eye: Secondary | ICD-10-CM

## 2015-08-20 DIAGNOSIS — E11311 Type 2 diabetes mellitus with unspecified diabetic retinopathy with macular edema: Secondary | ICD-10-CM

## 2015-08-21 ENCOUNTER — Encounter (HOSPITAL_COMMUNITY)
Admission: RE | Admit: 2015-08-21 | Discharge: 2015-08-21 | Disposition: A | Discharge status: Home / Self Care | Payer: PPO | Source: Ambulatory Visit | Attending: Nephrology | Admitting: Nephrology

## 2015-08-21 DIAGNOSIS — N185 Chronic kidney disease, stage 5: Secondary | ICD-10-CM | POA: Diagnosis present

## 2015-08-21 DIAGNOSIS — D631 Anemia in chronic kidney disease: Secondary | ICD-10-CM | POA: Insufficient documentation

## 2015-08-21 LAB — HEMOGLOBIN AND HEMATOCRIT, BLOOD
HEMATOCRIT: 28.4 % — AB (ref 39.0–52.0)
Hemoglobin: 9.7 g/dL — ABNORMAL LOW (ref 13.0–17.0)

## 2015-08-21 MED ORDER — EPOETIN ALFA 4000 UNIT/ML IJ SOLN
3000.0000 [IU] | Freq: Once | INTRAMUSCULAR | Status: DC
Start: 2015-08-21 — End: 2015-08-22

## 2015-08-21 MED ORDER — EPOETIN ALFA 3000 UNIT/ML IJ SOLN
3000.0000 [IU] | INTRAMUSCULAR | Status: DC
  Filled 2015-08-21: qty 1

## 2015-08-21 MED ORDER — EPOETIN ALFA 4000 UNIT/ML IJ SOLN
INTRAMUSCULAR | Status: AC
  Filled 2015-08-21: qty 1

## 2015-09-04 ENCOUNTER — Encounter (HOSPITAL_COMMUNITY): Payer: Self-pay

## 2015-09-04 ENCOUNTER — Encounter (HOSPITAL_COMMUNITY)
Admission: RE | Admit: 2015-09-04 | Discharge: 2015-09-04 | Disposition: A | Discharge status: Home / Self Care | Payer: PPO | Source: Ambulatory Visit | Attending: Nephrology | Admitting: Nephrology

## 2015-09-04 DIAGNOSIS — N185 Chronic kidney disease, stage 5: Secondary | ICD-10-CM | POA: Insufficient documentation

## 2015-09-04 DIAGNOSIS — D631 Anemia in chronic kidney disease: Secondary | ICD-10-CM | POA: Diagnosis not present

## 2015-09-04 LAB — HEMOGLOBIN AND HEMATOCRIT, BLOOD
HEMATOCRIT: 28.8 % — AB (ref 39.0–52.0)
Hemoglobin: 9.8 g/dL — ABNORMAL LOW (ref 13.0–17.0)

## 2015-09-04 MED ORDER — EPOETIN ALFA 4000 UNIT/ML IJ SOLN
3000.0000 [IU] | INTRAMUSCULAR | Status: DC
  Administered 2015-09-04: 3000 [IU] via SUBCUTANEOUS
  Filled 2015-09-04 (×2): qty 1

## 2015-09-04 NOTE — Progress Notes (Signed)
Results for Nathaniel Martinez, Nathaniel Martinez (MRN [id number]) as of 09/04/2015 10:40  Ref. Range 09/04/2015 09:44  Hemoglobin Latest Ref Range: 13.0-17.0 g/dL 9.8 (L)  HCT Latest Ref Range: 39.0-52.0 % 28.8 (L)

## 2015-09-18 ENCOUNTER — Encounter (HOSPITAL_COMMUNITY): Payer: Self-pay

## 2015-09-18 ENCOUNTER — Encounter (HOSPITAL_COMMUNITY)
Admission: RE | Admit: 2015-09-18 | Discharge: 2015-09-18 | Disposition: A | Discharge status: Home / Self Care | Payer: PPO | Source: Ambulatory Visit | Attending: Nephrology | Admitting: Nephrology

## 2015-09-18 DIAGNOSIS — N185 Chronic kidney disease, stage 5: Secondary | ICD-10-CM | POA: Diagnosis not present

## 2015-09-18 LAB — HEMOGLOBIN AND HEMATOCRIT, BLOOD
HEMATOCRIT: 28.7 % — AB (ref 39.0–52.0)
HEMOGLOBIN: 9.6 g/dL — AB (ref 13.0–17.0)

## 2015-09-18 MED ORDER — EPOETIN ALFA 4000 UNIT/ML IJ SOLN
3000.0000 [IU] | INTRAMUSCULAR | Status: DC
  Administered 2015-09-18: 3000 [IU] via SUBCUTANEOUS
  Filled 2015-09-18: qty 1

## 2015-09-18 NOTE — Progress Notes (Signed)
Results for WOODRUFF, DEGOLIER (MRN [id number]) as of 09/18/2015 10:20 Procrit 3,000 units given as indicated, next appointment 10/02/2015  Ref. Range 09/18/2015 09:35  Hemoglobin Latest Ref Range: 13.0-17.0 g/dL 9.6 (L)  HCT Latest Ref Range: 39.0-52.0 % 28.7 (L)

## 2015-10-01 ENCOUNTER — Encounter (INDEPENDENT_AMBULATORY_CARE_PROVIDER_SITE_OTHER): Payer: PPO | Admitting: Ophthalmology

## 2015-10-01 DIAGNOSIS — E113211 Type 2 diabetes mellitus with mild nonproliferative diabetic retinopathy with macular edema, right eye: Secondary | ICD-10-CM

## 2015-10-01 DIAGNOSIS — E11311 Type 2 diabetes mellitus with unspecified diabetic retinopathy with macular edema: Secondary | ICD-10-CM

## 2015-10-01 DIAGNOSIS — E113292 Type 2 diabetes mellitus with mild nonproliferative diabetic retinopathy without macular edema, left eye: Secondary | ICD-10-CM | POA: Diagnosis not present

## 2015-10-01 DIAGNOSIS — H43813 Vitreous degeneration, bilateral: Secondary | ICD-10-CM

## 2015-10-02 ENCOUNTER — Encounter (HOSPITAL_COMMUNITY)
Admission: RE | Admit: 2015-10-02 | Discharge: 2015-10-02 | Disposition: A | Discharge status: Home / Self Care | Payer: PPO | Source: Ambulatory Visit | Attending: Nephrology | Admitting: Nephrology

## 2015-10-02 DIAGNOSIS — D631 Anemia in chronic kidney disease: Secondary | ICD-10-CM | POA: Insufficient documentation

## 2015-10-02 DIAGNOSIS — N185 Chronic kidney disease, stage 5: Secondary | ICD-10-CM | POA: Insufficient documentation

## 2015-10-02 LAB — HEMOGLOBIN AND HEMATOCRIT, BLOOD
HEMATOCRIT: 30.2 % — AB (ref 39.0–52.0)
Hemoglobin: 10.2 g/dL — ABNORMAL LOW (ref 13.0–17.0)

## 2015-10-02 NOTE — Progress Notes (Signed)
Results for TOLUWALASE, MITROVIC (MRN [id number]) as of 10/02/2015 09:59  Ref. Range 10/02/2015 09:20  Hemoglobin Latest Ref Range: 13.0-17.0 g/dL 10.2 (L)  HCT Latest Ref Range: 39.0-52.0 % 30.2 (L)

## 2015-10-07 MED FILL — Epoetin Alfa Inj 4000 Unit/ML: INTRAMUSCULAR | Qty: 1 | Status: AC

## 2015-10-16 ENCOUNTER — Encounter (HOSPITAL_COMMUNITY)
Admission: RE | Admit: 2015-10-16 | Discharge: 2015-10-16 | Disposition: A | Discharge status: Home / Self Care | Payer: PPO | Source: Ambulatory Visit | Attending: Nephrology | Admitting: Nephrology

## 2015-10-16 ENCOUNTER — Encounter (HOSPITAL_COMMUNITY): Payer: Self-pay

## 2015-10-16 DIAGNOSIS — N185 Chronic kidney disease, stage 5: Secondary | ICD-10-CM | POA: Diagnosis not present

## 2015-10-16 LAB — HEMOGLOBIN AND HEMATOCRIT, BLOOD
HCT: 29.9 % — ABNORMAL LOW (ref 39.0–52.0)
HEMOGLOBIN: 10.2 g/dL — AB (ref 13.0–17.0)

## 2015-10-16 NOTE — Progress Notes (Signed)
Results for COLDEN, CZERNIAK (MRN [id number]) as of 10/16/2015 11:09  Ref. Range 10/16/2015 09:50  Hemoglobin Latest Ref Range: 13.0-17.0 g/dL 10.2 (L)  HCT Latest Ref Range: 39.0-52.0 % 29.9 (L)

## 2015-10-30 ENCOUNTER — Encounter (HOSPITAL_COMMUNITY)
Admission: RE | Admit: 2015-10-30 | Discharge: 2015-10-30 | Disposition: A | Discharge status: Home / Self Care | Payer: PPO | Source: Ambulatory Visit | Attending: Nephrology | Admitting: Nephrology

## 2015-10-30 DIAGNOSIS — N185 Chronic kidney disease, stage 5: Secondary | ICD-10-CM | POA: Diagnosis not present

## 2015-10-30 LAB — HEMOGLOBIN AND HEMATOCRIT, BLOOD
HEMATOCRIT: 27.9 % — AB (ref 39.0–52.0)
Hemoglobin: 9.6 g/dL — ABNORMAL LOW (ref 13.0–17.0)

## 2015-10-30 MED ORDER — EPOETIN ALFA 4000 UNIT/ML IJ SOLN
INTRAMUSCULAR | Status: AC
  Filled 2015-10-30: qty 1

## 2015-10-30 MED ORDER — EPOETIN ALFA 4000 UNIT/ML IJ SOLN
3000.0000 [IU] | Freq: Once | INTRAMUSCULAR | Status: AC
Start: 2015-10-30 — End: 2015-10-30
  Administered 2015-10-30: 3000 [IU] via SUBCUTANEOUS

## 2015-10-30 NOTE — Progress Notes (Signed)
Results for ANNA, GULDNER (MRN [id number]) as of 10/30/2015 10:51  Ref. Range 10/30/2015 09:55  Hemoglobin Latest Ref Range: 13.0-17.0 g/dL 9.6 (L)  HCT Latest Ref Range: 39.0-52.0 % 27.9 (L)

## 2015-11-04 DIAGNOSIS — D509 Iron deficiency anemia, unspecified: Secondary | ICD-10-CM | POA: Diagnosis not present

## 2015-11-04 DIAGNOSIS — R809 Proteinuria, unspecified: Secondary | ICD-10-CM | POA: Diagnosis not present

## 2015-11-04 DIAGNOSIS — E559 Vitamin D deficiency, unspecified: Secondary | ICD-10-CM | POA: Diagnosis not present

## 2015-11-04 DIAGNOSIS — Z79899 Other long term (current) drug therapy: Secondary | ICD-10-CM | POA: Diagnosis not present

## 2015-11-04 DIAGNOSIS — N183 Chronic kidney disease, stage 3 (moderate): Secondary | ICD-10-CM | POA: Diagnosis not present

## 2015-11-04 DIAGNOSIS — I10 Essential (primary) hypertension: Secondary | ICD-10-CM | POA: Diagnosis not present

## 2015-11-11 DIAGNOSIS — E1129 Type 2 diabetes mellitus with other diabetic kidney complication: Secondary | ICD-10-CM | POA: Diagnosis not present

## 2015-11-11 DIAGNOSIS — D631 Anemia in chronic kidney disease: Secondary | ICD-10-CM | POA: Diagnosis not present

## 2015-11-11 DIAGNOSIS — R809 Proteinuria, unspecified: Secondary | ICD-10-CM | POA: Diagnosis not present

## 2015-11-11 DIAGNOSIS — N185 Chronic kidney disease, stage 5: Secondary | ICD-10-CM | POA: Diagnosis not present

## 2015-11-11 DIAGNOSIS — I10 Essential (primary) hypertension: Secondary | ICD-10-CM | POA: Diagnosis not present

## 2015-11-12 ENCOUNTER — Encounter (INDEPENDENT_AMBULATORY_CARE_PROVIDER_SITE_OTHER): Payer: PPO | Admitting: Ophthalmology

## 2015-11-12 DIAGNOSIS — E113292 Type 2 diabetes mellitus with mild nonproliferative diabetic retinopathy without macular edema, left eye: Secondary | ICD-10-CM | POA: Diagnosis not present

## 2015-11-12 DIAGNOSIS — E11311 Type 2 diabetes mellitus with unspecified diabetic retinopathy with macular edema: Secondary | ICD-10-CM | POA: Diagnosis not present

## 2015-11-12 DIAGNOSIS — H43813 Vitreous degeneration, bilateral: Secondary | ICD-10-CM | POA: Diagnosis not present

## 2015-11-12 DIAGNOSIS — E113211 Type 2 diabetes mellitus with mild nonproliferative diabetic retinopathy with macular edema, right eye: Secondary | ICD-10-CM

## 2015-11-13 ENCOUNTER — Encounter (HOSPITAL_COMMUNITY)
Admission: RE | Admit: 2015-11-13 | Discharge: 2015-11-13 | Disposition: A | Discharge status: Home / Self Care | Payer: PPO | Source: Ambulatory Visit | Attending: Nephrology | Admitting: Nephrology

## 2015-11-13 ENCOUNTER — Other Ambulatory Visit (HOSPITAL_COMMUNITY): Payer: PPO

## 2015-11-13 DIAGNOSIS — D631 Anemia in chronic kidney disease: Secondary | ICD-10-CM | POA: Diagnosis not present

## 2015-11-13 DIAGNOSIS — N185 Chronic kidney disease, stage 5: Secondary | ICD-10-CM | POA: Insufficient documentation

## 2015-11-13 LAB — HEMOGLOBIN AND HEMATOCRIT, BLOOD
HCT: 28.6 % — ABNORMAL LOW (ref 39.0–52.0)
Hemoglobin: 9.7 g/dL — ABNORMAL LOW (ref 13.0–17.0)

## 2015-11-13 MED ORDER — EPOETIN ALFA 3000 UNIT/ML IJ SOLN
3000.0000 [IU] | Freq: Once | INTRAMUSCULAR | Status: AC
  Administered 2015-11-13: 3000 [IU] via SUBCUTANEOUS

## 2015-11-13 MED ORDER — EPOETIN ALFA 3000 UNIT/ML IJ SOLN
INTRAMUSCULAR | Status: AC
  Filled 2015-11-13: qty 1

## 2015-11-13 NOTE — Progress Notes (Signed)
Results for Nathaniel Martinez, Nathaniel Martinez (MRN [id number]) as of 11/13/2015 10:15  Patient arrived today for labs, Procrit 3000 units SQ given as indicated by order. Next appointment 11/27/2015 @ 0930  Ref. Range 11/13/2015 09:27  Hemoglobin Latest Ref Range: 13.0-17.0 g/dL 9.7 (L)  HCT Latest Ref Range: 39.0-52.0 % 28.6 (L)

## 2015-11-19 DIAGNOSIS — D649 Anemia, unspecified: Secondary | ICD-10-CM | POA: Diagnosis not present

## 2015-11-19 DIAGNOSIS — E1165 Type 2 diabetes mellitus with hyperglycemia: Secondary | ICD-10-CM | POA: Diagnosis not present

## 2015-11-19 DIAGNOSIS — I10 Essential (primary) hypertension: Secondary | ICD-10-CM | POA: Diagnosis not present

## 2015-11-19 DIAGNOSIS — N184 Chronic kidney disease, stage 4 (severe): Secondary | ICD-10-CM | POA: Diagnosis not present

## 2015-11-27 ENCOUNTER — Encounter (HOSPITAL_COMMUNITY)
Admission: RE | Admit: 2015-11-27 | Discharge: 2015-11-27 | Disposition: A | Discharge status: Home / Self Care | Payer: PPO | Source: Ambulatory Visit | Attending: Nephrology | Admitting: Nephrology

## 2015-11-27 ENCOUNTER — Encounter (HOSPITAL_COMMUNITY): Payer: Self-pay

## 2015-11-27 DIAGNOSIS — N185 Chronic kidney disease, stage 5: Secondary | ICD-10-CM | POA: Diagnosis not present

## 2015-11-27 LAB — HEMOGLOBIN AND HEMATOCRIT, BLOOD
HEMATOCRIT: 28.5 % — AB (ref 39.0–52.0)
HEMOGLOBIN: 9.8 g/dL — AB (ref 13.0–17.0)

## 2015-11-27 MED ORDER — EPOETIN ALFA 3000 UNIT/ML IJ SOLN
3000.0000 [IU] | INTRAMUSCULAR | Status: DC
  Administered 2015-11-27: 3000 [IU] via SUBCUTANEOUS
  Filled 2015-11-27: qty 1

## 2015-11-27 NOTE — Progress Notes (Signed)
Results for Nathaniel Martinez, Nathaniel Martinez (MRN [id number]) as of 11/27/2015 10:16  Ref. Range 11/27/2015 09:00  Hemoglobin Latest Ref Range: 13.0-17.0 g/dL 9.8 (L)  HCT Latest Ref Range: 39.0-52.0 % 28.5 (L)   Procrit 3000 units given as indicated. Next visit 12/11/2015 @ 0930

## 2015-12-11 ENCOUNTER — Encounter (HOSPITAL_COMMUNITY)
Admission: RE | Admit: 2015-12-11 | Discharge: 2015-12-11 | Disposition: A | Discharge status: Home / Self Care | Payer: PPO | Source: Ambulatory Visit | Attending: Nephrology | Admitting: Nephrology

## 2015-12-11 ENCOUNTER — Ambulatory Visit (HOSPITAL_COMMUNITY): Payer: PPO

## 2015-12-11 ENCOUNTER — Other Ambulatory Visit (HOSPITAL_COMMUNITY): Payer: PPO

## 2015-12-11 ENCOUNTER — Encounter (HOSPITAL_COMMUNITY): Payer: Self-pay

## 2015-12-11 DIAGNOSIS — D631 Anemia in chronic kidney disease: Secondary | ICD-10-CM | POA: Insufficient documentation

## 2015-12-11 DIAGNOSIS — N185 Chronic kidney disease, stage 5: Secondary | ICD-10-CM | POA: Diagnosis not present

## 2015-12-11 LAB — HEMOGLOBIN AND HEMATOCRIT, BLOOD
HEMATOCRIT: 29.9 % — AB (ref 39.0–52.0)
Hemoglobin: 10.3 g/dL — ABNORMAL LOW (ref 13.0–17.0)

## 2015-12-11 NOTE — Progress Notes (Signed)
hgb 10.3 therefore procrit withheld due to orders.

## 2015-12-17 DIAGNOSIS — D649 Anemia, unspecified: Secondary | ICD-10-CM | POA: Diagnosis not present

## 2015-12-17 DIAGNOSIS — I10 Essential (primary) hypertension: Secondary | ICD-10-CM | POA: Diagnosis not present

## 2015-12-17 DIAGNOSIS — N184 Chronic kidney disease, stage 4 (severe): Secondary | ICD-10-CM | POA: Diagnosis not present

## 2015-12-17 DIAGNOSIS — E1165 Type 2 diabetes mellitus with hyperglycemia: Secondary | ICD-10-CM | POA: Diagnosis not present

## 2015-12-18 DIAGNOSIS — H401131 Primary open-angle glaucoma, bilateral, mild stage: Secondary | ICD-10-CM | POA: Diagnosis not present

## 2015-12-24 DIAGNOSIS — N184 Chronic kidney disease, stage 4 (severe): Secondary | ICD-10-CM | POA: Diagnosis not present

## 2015-12-24 DIAGNOSIS — R809 Proteinuria, unspecified: Secondary | ICD-10-CM | POA: Diagnosis not present

## 2015-12-24 DIAGNOSIS — D638 Anemia in other chronic diseases classified elsewhere: Secondary | ICD-10-CM | POA: Diagnosis not present

## 2015-12-24 DIAGNOSIS — I10 Essential (primary) hypertension: Secondary | ICD-10-CM | POA: Diagnosis not present

## 2015-12-24 DIAGNOSIS — N2581 Secondary hyperparathyroidism of renal origin: Secondary | ICD-10-CM | POA: Diagnosis not present

## 2015-12-25 ENCOUNTER — Encounter (HOSPITAL_COMMUNITY)
Admission: RE | Admit: 2015-12-25 | Discharge: 2015-12-25 | Disposition: A | Discharge status: Home / Self Care | Payer: PPO | Source: Ambulatory Visit | Attending: Nephrology | Admitting: Nephrology

## 2015-12-25 DIAGNOSIS — N185 Chronic kidney disease, stage 5: Secondary | ICD-10-CM | POA: Diagnosis not present

## 2015-12-25 LAB — HEMOGLOBIN AND HEMATOCRIT, BLOOD
HCT: 28.3 % — ABNORMAL LOW (ref 39.0–52.0)
HEMOGLOBIN: 9.7 g/dL — AB (ref 13.0–17.0)

## 2015-12-25 MED ORDER — EPOETIN ALFA 3000 UNIT/ML IJ SOLN
3000.0000 [IU] | INTRAMUSCULAR | Status: DC
  Administered 2015-12-25: 3000 [IU] via SUBCUTANEOUS

## 2015-12-25 MED ORDER — EPOETIN ALFA 3000 UNIT/ML IJ SOLN
INTRAMUSCULAR | Status: AC
  Filled 2015-12-25: qty 1

## 2015-12-25 NOTE — Progress Notes (Signed)
Results for ARNOL, COPPEDGE (MRN [id number]) as of 12/25/2015 10:11  Ref. Range 12/25/2015 09:29  Hemoglobin Latest Ref Range: 13.0-17.0 g/dL 9.7 (L)  HCT Latest Ref Range: 39.0-52.0 % 28.3 (L)   Procrit 3000 units administered

## 2015-12-29 ENCOUNTER — Encounter (INDEPENDENT_AMBULATORY_CARE_PROVIDER_SITE_OTHER): Payer: PPO | Admitting: Ophthalmology

## 2015-12-29 DIAGNOSIS — E11311 Type 2 diabetes mellitus with unspecified diabetic retinopathy with macular edema: Secondary | ICD-10-CM | POA: Diagnosis not present

## 2015-12-29 DIAGNOSIS — H43813 Vitreous degeneration, bilateral: Secondary | ICD-10-CM | POA: Diagnosis not present

## 2015-12-29 DIAGNOSIS — E113292 Type 2 diabetes mellitus with mild nonproliferative diabetic retinopathy without macular edema, left eye: Secondary | ICD-10-CM

## 2015-12-29 DIAGNOSIS — E113211 Type 2 diabetes mellitus with mild nonproliferative diabetic retinopathy with macular edema, right eye: Secondary | ICD-10-CM | POA: Diagnosis not present

## 2016-01-05 DIAGNOSIS — I739 Peripheral vascular disease, unspecified: Secondary | ICD-10-CM | POA: Diagnosis not present

## 2016-01-05 DIAGNOSIS — L110 Acquired keratosis follicularis: Secondary | ICD-10-CM | POA: Diagnosis not present

## 2016-01-05 DIAGNOSIS — B351 Tinea unguium: Secondary | ICD-10-CM | POA: Diagnosis not present

## 2016-01-08 ENCOUNTER — Encounter (HOSPITAL_COMMUNITY)
Admission: RE | Admit: 2016-01-08 | Discharge: 2016-01-08 | Disposition: A | Discharge status: Home / Self Care | Payer: PPO | Source: Ambulatory Visit | Attending: Nephrology | Admitting: Nephrology

## 2016-01-08 DIAGNOSIS — N185 Chronic kidney disease, stage 5: Secondary | ICD-10-CM | POA: Insufficient documentation

## 2016-01-08 DIAGNOSIS — D631 Anemia in chronic kidney disease: Secondary | ICD-10-CM | POA: Insufficient documentation

## 2016-01-08 LAB — HEMOGLOBIN AND HEMATOCRIT, BLOOD
HCT: 28.1 % — ABNORMAL LOW (ref 39.0–52.0)
Hemoglobin: 9.7 g/dL — ABNORMAL LOW (ref 13.0–17.0)

## 2016-01-08 MED ORDER — EPOETIN ALFA 3000 UNIT/ML IJ SOLN
INTRAMUSCULAR | Status: AC
  Filled 2016-01-08: qty 1

## 2016-01-08 MED ORDER — EPOETIN ALFA 3000 UNIT/ML IJ SOLN
3000.0000 [IU] | INTRAMUSCULAR | Status: DC
  Administered 2016-01-08: 3000 [IU] via SUBCUTANEOUS

## 2016-01-21 DIAGNOSIS — I10 Essential (primary) hypertension: Secondary | ICD-10-CM | POA: Diagnosis not present

## 2016-01-21 DIAGNOSIS — Z79899 Other long term (current) drug therapy: Secondary | ICD-10-CM | POA: Diagnosis not present

## 2016-01-21 DIAGNOSIS — E559 Vitamin D deficiency, unspecified: Secondary | ICD-10-CM | POA: Diagnosis not present

## 2016-01-21 DIAGNOSIS — N184 Chronic kidney disease, stage 4 (severe): Secondary | ICD-10-CM | POA: Diagnosis not present

## 2016-01-21 DIAGNOSIS — R809 Proteinuria, unspecified: Secondary | ICD-10-CM | POA: Diagnosis not present

## 2016-01-21 DIAGNOSIS — D509 Iron deficiency anemia, unspecified: Secondary | ICD-10-CM | POA: Diagnosis not present

## 2016-01-22 ENCOUNTER — Encounter (HOSPITAL_COMMUNITY)
Admission: RE | Admit: 2016-01-22 | Discharge: 2016-01-22 | Disposition: A | Discharge status: Home / Self Care | Payer: PPO | Source: Ambulatory Visit | Attending: Nephrology | Admitting: Nephrology

## 2016-01-22 DIAGNOSIS — D631 Anemia in chronic kidney disease: Secondary | ICD-10-CM | POA: Diagnosis not present

## 2016-01-22 LAB — HEMOGLOBIN AND HEMATOCRIT, BLOOD
HCT: 29.4 % — ABNORMAL LOW (ref 39.0–52.0)
Hemoglobin: 10.2 g/dL — ABNORMAL LOW (ref 13.0–17.0)

## 2016-01-22 NOTE — Progress Notes (Signed)
Results for CHASEN, ERTL (MRN [id number]) as of 01/22/2016 10:23  Ref. Range 01/22/2016 09:59  Hemoglobin Latest Ref Range: 13.0-17.0 g/dL 10.2 (L)  HCT Latest Ref Range: 39.0-52.0 % 29.4 (L)

## 2016-01-28 DIAGNOSIS — N2581 Secondary hyperparathyroidism of renal origin: Secondary | ICD-10-CM | POA: Diagnosis not present

## 2016-01-28 DIAGNOSIS — R809 Proteinuria, unspecified: Secondary | ICD-10-CM | POA: Diagnosis not present

## 2016-01-28 DIAGNOSIS — D638 Anemia in other chronic diseases classified elsewhere: Secondary | ICD-10-CM | POA: Diagnosis not present

## 2016-01-28 DIAGNOSIS — Z111 Encounter for screening for respiratory tuberculosis: Secondary | ICD-10-CM | POA: Diagnosis not present

## 2016-01-28 DIAGNOSIS — N185 Chronic kidney disease, stage 5: Secondary | ICD-10-CM | POA: Diagnosis not present

## 2016-02-03 DIAGNOSIS — D509 Iron deficiency anemia, unspecified: Secondary | ICD-10-CM | POA: Diagnosis not present

## 2016-02-03 DIAGNOSIS — Z23 Encounter for immunization: Secondary | ICD-10-CM | POA: Diagnosis not present

## 2016-02-03 DIAGNOSIS — Z992 Dependence on renal dialysis: Secondary | ICD-10-CM | POA: Diagnosis not present

## 2016-02-03 DIAGNOSIS — N186 End stage renal disease: Secondary | ICD-10-CM | POA: Diagnosis not present

## 2016-02-03 DIAGNOSIS — D631 Anemia in chronic kidney disease: Secondary | ICD-10-CM | POA: Diagnosis not present

## 2016-02-05 ENCOUNTER — Other Ambulatory Visit (HOSPITAL_COMMUNITY): Payer: PPO

## 2016-02-05 DIAGNOSIS — E119 Type 2 diabetes mellitus without complications: Secondary | ICD-10-CM | POA: Diagnosis not present

## 2016-02-11 DIAGNOSIS — H401131 Primary open-angle glaucoma, bilateral, mild stage: Secondary | ICD-10-CM | POA: Diagnosis not present

## 2016-02-23 ENCOUNTER — Encounter (INDEPENDENT_AMBULATORY_CARE_PROVIDER_SITE_OTHER): Payer: PPO | Admitting: Ophthalmology

## 2016-02-23 DIAGNOSIS — E11311 Type 2 diabetes mellitus with unspecified diabetic retinopathy with macular edema: Secondary | ICD-10-CM | POA: Diagnosis not present

## 2016-02-23 DIAGNOSIS — E113211 Type 2 diabetes mellitus with mild nonproliferative diabetic retinopathy with macular edema, right eye: Secondary | ICD-10-CM

## 2016-02-23 DIAGNOSIS — H43813 Vitreous degeneration, bilateral: Secondary | ICD-10-CM | POA: Diagnosis not present

## 2016-02-23 DIAGNOSIS — E113292 Type 2 diabetes mellitus with mild nonproliferative diabetic retinopathy without macular edema, left eye: Secondary | ICD-10-CM

## 2016-02-25 DIAGNOSIS — E1165 Type 2 diabetes mellitus with hyperglycemia: Secondary | ICD-10-CM | POA: Diagnosis not present

## 2016-02-25 DIAGNOSIS — N186 End stage renal disease: Secondary | ICD-10-CM | POA: Diagnosis not present

## 2016-02-25 DIAGNOSIS — I10 Essential (primary) hypertension: Secondary | ICD-10-CM | POA: Diagnosis not present

## 2016-02-25 DIAGNOSIS — D649 Anemia, unspecified: Secondary | ICD-10-CM | POA: Diagnosis not present

## 2016-02-29 DIAGNOSIS — N186 End stage renal disease: Secondary | ICD-10-CM | POA: Diagnosis not present

## 2016-02-29 DIAGNOSIS — Z992 Dependence on renal dialysis: Secondary | ICD-10-CM | POA: Diagnosis not present

## 2016-03-02 DIAGNOSIS — Z992 Dependence on renal dialysis: Secondary | ICD-10-CM | POA: Diagnosis not present

## 2016-03-02 DIAGNOSIS — D509 Iron deficiency anemia, unspecified: Secondary | ICD-10-CM | POA: Diagnosis not present

## 2016-03-02 DIAGNOSIS — N2581 Secondary hyperparathyroidism of renal origin: Secondary | ICD-10-CM | POA: Diagnosis not present

## 2016-03-02 DIAGNOSIS — N186 End stage renal disease: Secondary | ICD-10-CM | POA: Diagnosis not present

## 2016-03-02 DIAGNOSIS — D631 Anemia in chronic kidney disease: Secondary | ICD-10-CM | POA: Diagnosis not present

## 2016-03-02 DIAGNOSIS — Z23 Encounter for immunization: Secondary | ICD-10-CM | POA: Diagnosis not present

## 2016-03-05 DIAGNOSIS — H401131 Primary open-angle glaucoma, bilateral, mild stage: Secondary | ICD-10-CM | POA: Diagnosis not present

## 2016-03-31 DIAGNOSIS — N186 End stage renal disease: Secondary | ICD-10-CM | POA: Diagnosis not present

## 2016-03-31 DIAGNOSIS — Z992 Dependence on renal dialysis: Secondary | ICD-10-CM | POA: Diagnosis not present

## 2016-04-01 DIAGNOSIS — Z23 Encounter for immunization: Secondary | ICD-10-CM | POA: Diagnosis not present

## 2016-04-01 DIAGNOSIS — N2581 Secondary hyperparathyroidism of renal origin: Secondary | ICD-10-CM | POA: Diagnosis not present

## 2016-04-01 DIAGNOSIS — N186 End stage renal disease: Secondary | ICD-10-CM | POA: Diagnosis not present

## 2016-04-01 DIAGNOSIS — Z992 Dependence on renal dialysis: Secondary | ICD-10-CM | POA: Diagnosis not present

## 2016-04-01 DIAGNOSIS — D631 Anemia in chronic kidney disease: Secondary | ICD-10-CM | POA: Diagnosis not present

## 2016-04-01 DIAGNOSIS — D509 Iron deficiency anemia, unspecified: Secondary | ICD-10-CM | POA: Diagnosis not present

## 2016-04-05 ENCOUNTER — Encounter (INDEPENDENT_AMBULATORY_CARE_PROVIDER_SITE_OTHER): Payer: PPO | Admitting: Ophthalmology

## 2016-04-05 DIAGNOSIS — E11311 Type 2 diabetes mellitus with unspecified diabetic retinopathy with macular edema: Secondary | ICD-10-CM | POA: Diagnosis not present

## 2016-04-05 DIAGNOSIS — E113211 Type 2 diabetes mellitus with mild nonproliferative diabetic retinopathy with macular edema, right eye: Secondary | ICD-10-CM | POA: Diagnosis not present

## 2016-04-05 DIAGNOSIS — E113292 Type 2 diabetes mellitus with mild nonproliferative diabetic retinopathy without macular edema, left eye: Secondary | ICD-10-CM

## 2016-04-05 DIAGNOSIS — H43813 Vitreous degeneration, bilateral: Secondary | ICD-10-CM

## 2016-04-12 DIAGNOSIS — L110 Acquired keratosis follicularis: Secondary | ICD-10-CM | POA: Diagnosis not present

## 2016-04-12 DIAGNOSIS — I739 Peripheral vascular disease, unspecified: Secondary | ICD-10-CM | POA: Diagnosis not present

## 2016-04-12 DIAGNOSIS — B351 Tinea unguium: Secondary | ICD-10-CM | POA: Diagnosis not present

## 2016-04-29 DIAGNOSIS — E119 Type 2 diabetes mellitus without complications: Secondary | ICD-10-CM | POA: Diagnosis not present

## 2016-04-30 DIAGNOSIS — Z992 Dependence on renal dialysis: Secondary | ICD-10-CM | POA: Diagnosis not present

## 2016-04-30 DIAGNOSIS — N186 End stage renal disease: Secondary | ICD-10-CM | POA: Diagnosis not present

## 2016-05-01 DIAGNOSIS — N186 End stage renal disease: Secondary | ICD-10-CM | POA: Diagnosis not present

## 2016-05-01 DIAGNOSIS — Z992 Dependence on renal dialysis: Secondary | ICD-10-CM | POA: Diagnosis not present

## 2016-05-01 DIAGNOSIS — D631 Anemia in chronic kidney disease: Secondary | ICD-10-CM | POA: Diagnosis not present

## 2016-05-01 DIAGNOSIS — D509 Iron deficiency anemia, unspecified: Secondary | ICD-10-CM | POA: Diagnosis not present

## 2016-05-01 DIAGNOSIS — N2581 Secondary hyperparathyroidism of renal origin: Secondary | ICD-10-CM | POA: Diagnosis not present

## 2016-05-17 ENCOUNTER — Encounter (INDEPENDENT_AMBULATORY_CARE_PROVIDER_SITE_OTHER): Payer: PPO | Admitting: Ophthalmology

## 2016-05-17 DIAGNOSIS — H43813 Vitreous degeneration, bilateral: Secondary | ICD-10-CM

## 2016-05-17 DIAGNOSIS — E113292 Type 2 diabetes mellitus with mild nonproliferative diabetic retinopathy without macular edema, left eye: Secondary | ICD-10-CM

## 2016-05-17 DIAGNOSIS — E11311 Type 2 diabetes mellitus with unspecified diabetic retinopathy with macular edema: Secondary | ICD-10-CM

## 2016-05-17 DIAGNOSIS — E113311 Type 2 diabetes mellitus with moderate nonproliferative diabetic retinopathy with macular edema, right eye: Secondary | ICD-10-CM

## 2016-05-26 DIAGNOSIS — I10 Essential (primary) hypertension: Secondary | ICD-10-CM | POA: Diagnosis not present

## 2016-05-26 DIAGNOSIS — N186 End stage renal disease: Secondary | ICD-10-CM | POA: Diagnosis not present

## 2016-05-26 DIAGNOSIS — D649 Anemia, unspecified: Secondary | ICD-10-CM | POA: Diagnosis not present

## 2016-05-26 DIAGNOSIS — E1165 Type 2 diabetes mellitus with hyperglycemia: Secondary | ICD-10-CM | POA: Diagnosis not present

## 2016-05-31 DIAGNOSIS — N186 End stage renal disease: Secondary | ICD-10-CM | POA: Diagnosis not present

## 2016-05-31 DIAGNOSIS — Z992 Dependence on renal dialysis: Secondary | ICD-10-CM | POA: Diagnosis not present

## 2016-06-01 DIAGNOSIS — N186 End stage renal disease: Secondary | ICD-10-CM | POA: Diagnosis not present

## 2016-06-01 DIAGNOSIS — Z992 Dependence on renal dialysis: Secondary | ICD-10-CM | POA: Diagnosis not present

## 2016-06-01 DIAGNOSIS — D631 Anemia in chronic kidney disease: Secondary | ICD-10-CM | POA: Diagnosis not present

## 2016-06-01 DIAGNOSIS — D509 Iron deficiency anemia, unspecified: Secondary | ICD-10-CM | POA: Diagnosis not present

## 2016-06-28 ENCOUNTER — Encounter (INDEPENDENT_AMBULATORY_CARE_PROVIDER_SITE_OTHER): Payer: PPO | Admitting: Ophthalmology

## 2016-07-01 DIAGNOSIS — N186 End stage renal disease: Secondary | ICD-10-CM | POA: Diagnosis not present

## 2016-07-01 DIAGNOSIS — Z992 Dependence on renal dialysis: Secondary | ICD-10-CM | POA: Diagnosis not present

## 2016-07-03 DIAGNOSIS — D631 Anemia in chronic kidney disease: Secondary | ICD-10-CM | POA: Diagnosis not present

## 2016-07-03 DIAGNOSIS — N2581 Secondary hyperparathyroidism of renal origin: Secondary | ICD-10-CM | POA: Diagnosis not present

## 2016-07-03 DIAGNOSIS — D509 Iron deficiency anemia, unspecified: Secondary | ICD-10-CM | POA: Diagnosis not present

## 2016-07-03 DIAGNOSIS — Z992 Dependence on renal dialysis: Secondary | ICD-10-CM | POA: Diagnosis not present

## 2016-07-03 DIAGNOSIS — N186 End stage renal disease: Secondary | ICD-10-CM | POA: Diagnosis not present

## 2016-07-03 DIAGNOSIS — Z23 Encounter for immunization: Secondary | ICD-10-CM | POA: Diagnosis not present

## 2016-07-08 ENCOUNTER — Encounter (INDEPENDENT_AMBULATORY_CARE_PROVIDER_SITE_OTHER): Payer: PPO | Admitting: Ophthalmology

## 2016-07-19 ENCOUNTER — Encounter (INDEPENDENT_AMBULATORY_CARE_PROVIDER_SITE_OTHER): Payer: PPO | Admitting: Ophthalmology

## 2016-07-19 DIAGNOSIS — E113211 Type 2 diabetes mellitus with mild nonproliferative diabetic retinopathy with macular edema, right eye: Secondary | ICD-10-CM

## 2016-07-19 DIAGNOSIS — E11311 Type 2 diabetes mellitus with unspecified diabetic retinopathy with macular edema: Secondary | ICD-10-CM | POA: Diagnosis not present

## 2016-07-19 DIAGNOSIS — E113292 Type 2 diabetes mellitus with mild nonproliferative diabetic retinopathy without macular edema, left eye: Secondary | ICD-10-CM

## 2016-07-19 DIAGNOSIS — H43813 Vitreous degeneration, bilateral: Secondary | ICD-10-CM

## 2016-07-27 DIAGNOSIS — E119 Type 2 diabetes mellitus without complications: Secondary | ICD-10-CM | POA: Diagnosis not present

## 2016-07-31 DIAGNOSIS — Z992 Dependence on renal dialysis: Secondary | ICD-10-CM | POA: Diagnosis not present

## 2016-07-31 DIAGNOSIS — N186 End stage renal disease: Secondary | ICD-10-CM | POA: Diagnosis not present

## 2016-08-03 DIAGNOSIS — Z23 Encounter for immunization: Secondary | ICD-10-CM | POA: Diagnosis not present

## 2016-08-03 DIAGNOSIS — N186 End stage renal disease: Secondary | ICD-10-CM | POA: Diagnosis not present

## 2016-08-03 DIAGNOSIS — D509 Iron deficiency anemia, unspecified: Secondary | ICD-10-CM | POA: Diagnosis not present

## 2016-08-03 DIAGNOSIS — D631 Anemia in chronic kidney disease: Secondary | ICD-10-CM | POA: Diagnosis not present

## 2016-08-03 DIAGNOSIS — Z992 Dependence on renal dialysis: Secondary | ICD-10-CM | POA: Diagnosis not present

## 2016-08-11 DIAGNOSIS — D649 Anemia, unspecified: Secondary | ICD-10-CM | POA: Diagnosis not present

## 2016-08-11 DIAGNOSIS — N186 End stage renal disease: Secondary | ICD-10-CM | POA: Diagnosis not present

## 2016-08-11 DIAGNOSIS — I10 Essential (primary) hypertension: Secondary | ICD-10-CM | POA: Diagnosis not present

## 2016-08-11 DIAGNOSIS — E119 Type 2 diabetes mellitus without complications: Secondary | ICD-10-CM | POA: Diagnosis not present

## 2016-08-16 DIAGNOSIS — I739 Peripheral vascular disease, unspecified: Secondary | ICD-10-CM | POA: Diagnosis not present

## 2016-08-16 DIAGNOSIS — B351 Tinea unguium: Secondary | ICD-10-CM | POA: Diagnosis not present

## 2016-08-16 DIAGNOSIS — L110 Acquired keratosis follicularis: Secondary | ICD-10-CM | POA: Diagnosis not present

## 2016-08-30 ENCOUNTER — Encounter (INDEPENDENT_AMBULATORY_CARE_PROVIDER_SITE_OTHER): Payer: PPO | Admitting: Ophthalmology

## 2016-08-31 DIAGNOSIS — Z992 Dependence on renal dialysis: Secondary | ICD-10-CM | POA: Diagnosis not present

## 2016-08-31 DIAGNOSIS — N186 End stage renal disease: Secondary | ICD-10-CM | POA: Diagnosis not present

## 2016-09-02 DIAGNOSIS — D509 Iron deficiency anemia, unspecified: Secondary | ICD-10-CM | POA: Diagnosis not present

## 2016-09-02 DIAGNOSIS — D631 Anemia in chronic kidney disease: Secondary | ICD-10-CM | POA: Diagnosis not present

## 2016-09-02 DIAGNOSIS — Z992 Dependence on renal dialysis: Secondary | ICD-10-CM | POA: Diagnosis not present

## 2016-09-02 DIAGNOSIS — N186 End stage renal disease: Secondary | ICD-10-CM | POA: Diagnosis not present

## 2016-09-30 DIAGNOSIS — Z992 Dependence on renal dialysis: Secondary | ICD-10-CM | POA: Diagnosis not present

## 2016-09-30 DIAGNOSIS — N186 End stage renal disease: Secondary | ICD-10-CM | POA: Diagnosis not present

## 2016-10-02 DIAGNOSIS — D631 Anemia in chronic kidney disease: Secondary | ICD-10-CM | POA: Diagnosis not present

## 2016-10-02 DIAGNOSIS — Z992 Dependence on renal dialysis: Secondary | ICD-10-CM | POA: Diagnosis not present

## 2016-10-02 DIAGNOSIS — N186 End stage renal disease: Secondary | ICD-10-CM | POA: Diagnosis not present

## 2016-10-02 DIAGNOSIS — D509 Iron deficiency anemia, unspecified: Secondary | ICD-10-CM | POA: Diagnosis not present

## 2016-10-21 DIAGNOSIS — E119 Type 2 diabetes mellitus without complications: Secondary | ICD-10-CM | POA: Diagnosis not present

## 2016-10-31 DIAGNOSIS — N186 End stage renal disease: Secondary | ICD-10-CM | POA: Diagnosis not present

## 2016-10-31 DIAGNOSIS — Z992 Dependence on renal dialysis: Secondary | ICD-10-CM | POA: Diagnosis not present

## 2016-11-02 DIAGNOSIS — N186 End stage renal disease: Secondary | ICD-10-CM | POA: Diagnosis not present

## 2016-11-02 DIAGNOSIS — D631 Anemia in chronic kidney disease: Secondary | ICD-10-CM | POA: Diagnosis not present

## 2016-11-02 DIAGNOSIS — Z992 Dependence on renal dialysis: Secondary | ICD-10-CM | POA: Diagnosis not present

## 2016-11-02 DIAGNOSIS — D509 Iron deficiency anemia, unspecified: Secondary | ICD-10-CM | POA: Diagnosis not present

## 2016-11-05 DIAGNOSIS — N186 End stage renal disease: Secondary | ICD-10-CM | POA: Diagnosis not present

## 2016-11-05 DIAGNOSIS — I10 Essential (primary) hypertension: Secondary | ICD-10-CM | POA: Diagnosis not present

## 2016-11-05 DIAGNOSIS — M1990 Unspecified osteoarthritis, unspecified site: Secondary | ICD-10-CM | POA: Diagnosis not present

## 2016-11-05 DIAGNOSIS — E1165 Type 2 diabetes mellitus with hyperglycemia: Secondary | ICD-10-CM | POA: Diagnosis not present

## 2016-12-01 DIAGNOSIS — Z992 Dependence on renal dialysis: Secondary | ICD-10-CM | POA: Diagnosis not present

## 2016-12-01 DIAGNOSIS — N186 End stage renal disease: Secondary | ICD-10-CM | POA: Diagnosis not present

## 2016-12-02 DIAGNOSIS — D631 Anemia in chronic kidney disease: Secondary | ICD-10-CM | POA: Diagnosis not present

## 2016-12-02 DIAGNOSIS — N186 End stage renal disease: Secondary | ICD-10-CM | POA: Diagnosis not present

## 2016-12-02 DIAGNOSIS — D509 Iron deficiency anemia, unspecified: Secondary | ICD-10-CM | POA: Diagnosis not present

## 2016-12-02 DIAGNOSIS — Z992 Dependence on renal dialysis: Secondary | ICD-10-CM | POA: Diagnosis not present

## 2016-12-13 DIAGNOSIS — B351 Tinea unguium: Secondary | ICD-10-CM | POA: Diagnosis not present

## 2016-12-13 DIAGNOSIS — I739 Peripheral vascular disease, unspecified: Secondary | ICD-10-CM | POA: Diagnosis not present

## 2016-12-13 DIAGNOSIS — L110 Acquired keratosis follicularis: Secondary | ICD-10-CM | POA: Diagnosis not present

## 2016-12-29 DIAGNOSIS — Z992 Dependence on renal dialysis: Secondary | ICD-10-CM | POA: Diagnosis not present

## 2016-12-29 DIAGNOSIS — N186 End stage renal disease: Secondary | ICD-10-CM | POA: Diagnosis not present

## 2016-12-30 DIAGNOSIS — Z992 Dependence on renal dialysis: Secondary | ICD-10-CM | POA: Diagnosis not present

## 2016-12-30 DIAGNOSIS — D631 Anemia in chronic kidney disease: Secondary | ICD-10-CM | POA: Diagnosis not present

## 2016-12-30 DIAGNOSIS — N186 End stage renal disease: Secondary | ICD-10-CM | POA: Diagnosis not present

## 2016-12-30 DIAGNOSIS — N2581 Secondary hyperparathyroidism of renal origin: Secondary | ICD-10-CM | POA: Diagnosis not present

## 2016-12-30 DIAGNOSIS — D509 Iron deficiency anemia, unspecified: Secondary | ICD-10-CM | POA: Diagnosis not present

## 2017-01-25 DIAGNOSIS — E119 Type 2 diabetes mellitus without complications: Secondary | ICD-10-CM | POA: Diagnosis not present

## 2017-01-29 DIAGNOSIS — N186 End stage renal disease: Secondary | ICD-10-CM | POA: Diagnosis not present

## 2017-01-29 DIAGNOSIS — Z992 Dependence on renal dialysis: Secondary | ICD-10-CM | POA: Diagnosis not present

## 2017-02-01 DIAGNOSIS — D509 Iron deficiency anemia, unspecified: Secondary | ICD-10-CM | POA: Diagnosis not present

## 2017-02-01 DIAGNOSIS — D631 Anemia in chronic kidney disease: Secondary | ICD-10-CM | POA: Diagnosis not present

## 2017-02-01 DIAGNOSIS — N186 End stage renal disease: Secondary | ICD-10-CM | POA: Diagnosis not present

## 2017-02-01 DIAGNOSIS — Z992 Dependence on renal dialysis: Secondary | ICD-10-CM | POA: Diagnosis not present

## 2017-02-04 DIAGNOSIS — N186 End stage renal disease: Secondary | ICD-10-CM | POA: Diagnosis not present

## 2017-02-04 DIAGNOSIS — M1990 Unspecified osteoarthritis, unspecified site: Secondary | ICD-10-CM | POA: Diagnosis not present

## 2017-02-04 DIAGNOSIS — E119 Type 2 diabetes mellitus without complications: Secondary | ICD-10-CM | POA: Diagnosis not present

## 2017-02-04 DIAGNOSIS — I10 Essential (primary) hypertension: Secondary | ICD-10-CM | POA: Diagnosis not present

## 2017-02-04 DIAGNOSIS — E1165 Type 2 diabetes mellitus with hyperglycemia: Secondary | ICD-10-CM | POA: Diagnosis not present

## 2017-02-28 DIAGNOSIS — Z992 Dependence on renal dialysis: Secondary | ICD-10-CM | POA: Diagnosis not present

## 2017-02-28 DIAGNOSIS — N186 End stage renal disease: Secondary | ICD-10-CM | POA: Diagnosis not present

## 2017-03-01 DIAGNOSIS — D509 Iron deficiency anemia, unspecified: Secondary | ICD-10-CM | POA: Diagnosis not present

## 2017-03-01 DIAGNOSIS — D631 Anemia in chronic kidney disease: Secondary | ICD-10-CM | POA: Diagnosis not present

## 2017-03-01 DIAGNOSIS — Z992 Dependence on renal dialysis: Secondary | ICD-10-CM | POA: Diagnosis not present

## 2017-03-01 DIAGNOSIS — N186 End stage renal disease: Secondary | ICD-10-CM | POA: Diagnosis not present

## 2017-03-31 DIAGNOSIS — Z992 Dependence on renal dialysis: Secondary | ICD-10-CM | POA: Diagnosis not present

## 2017-03-31 DIAGNOSIS — N186 End stage renal disease: Secondary | ICD-10-CM | POA: Diagnosis not present

## 2017-04-02 DIAGNOSIS — D509 Iron deficiency anemia, unspecified: Secondary | ICD-10-CM | POA: Diagnosis not present

## 2017-04-02 DIAGNOSIS — N186 End stage renal disease: Secondary | ICD-10-CM | POA: Diagnosis not present

## 2017-04-02 DIAGNOSIS — Z992 Dependence on renal dialysis: Secondary | ICD-10-CM | POA: Diagnosis not present

## 2017-04-02 DIAGNOSIS — D631 Anemia in chronic kidney disease: Secondary | ICD-10-CM | POA: Diagnosis not present

## 2017-04-02 DIAGNOSIS — N2581 Secondary hyperparathyroidism of renal origin: Secondary | ICD-10-CM | POA: Diagnosis not present

## 2017-04-04 DIAGNOSIS — L110 Acquired keratosis follicularis: Secondary | ICD-10-CM | POA: Diagnosis not present

## 2017-04-04 DIAGNOSIS — I739 Peripheral vascular disease, unspecified: Secondary | ICD-10-CM | POA: Diagnosis not present

## 2017-04-04 DIAGNOSIS — B351 Tinea unguium: Secondary | ICD-10-CM | POA: Diagnosis not present

## 2017-04-26 DIAGNOSIS — E119 Type 2 diabetes mellitus without complications: Secondary | ICD-10-CM | POA: Diagnosis not present

## 2017-04-30 DIAGNOSIS — Z992 Dependence on renal dialysis: Secondary | ICD-10-CM | POA: Diagnosis not present

## 2017-04-30 DIAGNOSIS — N186 End stage renal disease: Secondary | ICD-10-CM | POA: Diagnosis not present

## 2017-05-03 DIAGNOSIS — Z992 Dependence on renal dialysis: Secondary | ICD-10-CM | POA: Diagnosis not present

## 2017-05-03 DIAGNOSIS — D509 Iron deficiency anemia, unspecified: Secondary | ICD-10-CM | POA: Diagnosis not present

## 2017-05-03 DIAGNOSIS — N186 End stage renal disease: Secondary | ICD-10-CM | POA: Diagnosis not present

## 2017-05-03 DIAGNOSIS — D631 Anemia in chronic kidney disease: Secondary | ICD-10-CM | POA: Diagnosis not present

## 2017-05-09 DIAGNOSIS — N186 End stage renal disease: Secondary | ICD-10-CM | POA: Diagnosis not present

## 2017-05-09 DIAGNOSIS — E1165 Type 2 diabetes mellitus with hyperglycemia: Secondary | ICD-10-CM | POA: Diagnosis not present

## 2017-05-09 DIAGNOSIS — I10 Essential (primary) hypertension: Secondary | ICD-10-CM | POA: Diagnosis not present

## 2017-05-09 DIAGNOSIS — E785 Hyperlipidemia, unspecified: Secondary | ICD-10-CM | POA: Diagnosis not present

## 2017-06-02 DIAGNOSIS — Z992 Dependence on renal dialysis: Secondary | ICD-10-CM | POA: Diagnosis not present

## 2017-06-02 DIAGNOSIS — N186 End stage renal disease: Secondary | ICD-10-CM | POA: Diagnosis not present

## 2017-06-02 DIAGNOSIS — D509 Iron deficiency anemia, unspecified: Secondary | ICD-10-CM | POA: Diagnosis not present

## 2017-06-02 DIAGNOSIS — D631 Anemia in chronic kidney disease: Secondary | ICD-10-CM | POA: Diagnosis not present

## 2017-06-30 DIAGNOSIS — N186 End stage renal disease: Secondary | ICD-10-CM | POA: Diagnosis not present

## 2017-06-30 DIAGNOSIS — Z992 Dependence on renal dialysis: Secondary | ICD-10-CM | POA: Diagnosis not present

## 2017-07-02 DIAGNOSIS — D509 Iron deficiency anemia, unspecified: Secondary | ICD-10-CM | POA: Diagnosis not present

## 2017-07-02 DIAGNOSIS — D631 Anemia in chronic kidney disease: Secondary | ICD-10-CM | POA: Diagnosis not present

## 2017-07-02 DIAGNOSIS — Z23 Encounter for immunization: Secondary | ICD-10-CM | POA: Diagnosis not present

## 2017-07-02 DIAGNOSIS — N186 End stage renal disease: Secondary | ICD-10-CM | POA: Diagnosis not present

## 2017-07-02 DIAGNOSIS — Z992 Dependence on renal dialysis: Secondary | ICD-10-CM | POA: Diagnosis not present

## 2017-07-26 DIAGNOSIS — E119 Type 2 diabetes mellitus without complications: Secondary | ICD-10-CM | POA: Diagnosis not present

## 2017-07-31 DIAGNOSIS — N186 End stage renal disease: Secondary | ICD-10-CM | POA: Diagnosis not present

## 2017-07-31 DIAGNOSIS — Z992 Dependence on renal dialysis: Secondary | ICD-10-CM | POA: Diagnosis not present

## 2017-08-02 DIAGNOSIS — D509 Iron deficiency anemia, unspecified: Secondary | ICD-10-CM | POA: Diagnosis not present

## 2017-08-02 DIAGNOSIS — N186 End stage renal disease: Secondary | ICD-10-CM | POA: Diagnosis not present

## 2017-08-02 DIAGNOSIS — D631 Anemia in chronic kidney disease: Secondary | ICD-10-CM | POA: Diagnosis not present

## 2017-08-02 DIAGNOSIS — Z992 Dependence on renal dialysis: Secondary | ICD-10-CM | POA: Diagnosis not present

## 2017-08-08 DIAGNOSIS — Z1389 Encounter for screening for other disorder: Secondary | ICD-10-CM | POA: Diagnosis not present

## 2017-08-08 DIAGNOSIS — N186 End stage renal disease: Secondary | ICD-10-CM | POA: Diagnosis not present

## 2017-08-08 DIAGNOSIS — I10 Essential (primary) hypertension: Secondary | ICD-10-CM | POA: Diagnosis not present

## 2017-08-08 DIAGNOSIS — M1990 Unspecified osteoarthritis, unspecified site: Secondary | ICD-10-CM | POA: Diagnosis not present

## 2017-08-08 DIAGNOSIS — E1165 Type 2 diabetes mellitus with hyperglycemia: Secondary | ICD-10-CM | POA: Diagnosis not present

## 2017-08-30 DIAGNOSIS — Z992 Dependence on renal dialysis: Secondary | ICD-10-CM | POA: Diagnosis not present

## 2017-08-30 DIAGNOSIS — N186 End stage renal disease: Secondary | ICD-10-CM | POA: Diagnosis not present

## 2017-09-01 DIAGNOSIS — D509 Iron deficiency anemia, unspecified: Secondary | ICD-10-CM | POA: Diagnosis not present

## 2017-09-01 DIAGNOSIS — N186 End stage renal disease: Secondary | ICD-10-CM | POA: Diagnosis not present

## 2017-09-01 DIAGNOSIS — Z992 Dependence on renal dialysis: Secondary | ICD-10-CM | POA: Diagnosis not present

## 2017-09-01 DIAGNOSIS — D631 Anemia in chronic kidney disease: Secondary | ICD-10-CM | POA: Diagnosis not present

## 2017-09-02 DIAGNOSIS — E113292 Type 2 diabetes mellitus with mild nonproliferative diabetic retinopathy without macular edema, left eye: Secondary | ICD-10-CM | POA: Diagnosis not present

## 2017-09-02 DIAGNOSIS — E113211 Type 2 diabetes mellitus with mild nonproliferative diabetic retinopathy with macular edema, right eye: Secondary | ICD-10-CM | POA: Diagnosis not present

## 2017-09-02 DIAGNOSIS — H401131 Primary open-angle glaucoma, bilateral, mild stage: Secondary | ICD-10-CM | POA: Diagnosis not present

## 2017-09-02 DIAGNOSIS — E118 Type 2 diabetes mellitus with unspecified complications: Secondary | ICD-10-CM | POA: Diagnosis not present

## 2017-09-30 DIAGNOSIS — Z992 Dependence on renal dialysis: Secondary | ICD-10-CM | POA: Diagnosis not present

## 2017-09-30 DIAGNOSIS — N186 End stage renal disease: Secondary | ICD-10-CM | POA: Diagnosis not present

## 2017-10-01 DIAGNOSIS — D509 Iron deficiency anemia, unspecified: Secondary | ICD-10-CM | POA: Diagnosis not present

## 2017-10-01 DIAGNOSIS — D631 Anemia in chronic kidney disease: Secondary | ICD-10-CM | POA: Diagnosis not present

## 2017-10-01 DIAGNOSIS — N186 End stage renal disease: Secondary | ICD-10-CM | POA: Diagnosis not present

## 2017-10-01 DIAGNOSIS — Z992 Dependence on renal dialysis: Secondary | ICD-10-CM | POA: Diagnosis not present

## 2017-10-03 DIAGNOSIS — M79675 Pain in left toe(s): Secondary | ICD-10-CM | POA: Diagnosis not present

## 2017-10-03 DIAGNOSIS — L03032 Cellulitis of left toe: Secondary | ICD-10-CM | POA: Diagnosis not present

## 2017-10-03 DIAGNOSIS — M79672 Pain in left foot: Secondary | ICD-10-CM | POA: Diagnosis not present

## 2017-10-03 DIAGNOSIS — L600 Ingrowing nail: Secondary | ICD-10-CM | POA: Diagnosis not present

## 2017-10-31 DIAGNOSIS — N186 End stage renal disease: Secondary | ICD-10-CM | POA: Diagnosis not present

## 2017-10-31 DIAGNOSIS — Z992 Dependence on renal dialysis: Secondary | ICD-10-CM | POA: Diagnosis not present

## 2017-11-03 DIAGNOSIS — Z992 Dependence on renal dialysis: Secondary | ICD-10-CM | POA: Diagnosis not present

## 2017-11-03 DIAGNOSIS — D631 Anemia in chronic kidney disease: Secondary | ICD-10-CM | POA: Diagnosis not present

## 2017-11-03 DIAGNOSIS — N186 End stage renal disease: Secondary | ICD-10-CM | POA: Diagnosis not present

## 2017-11-09 DIAGNOSIS — E785 Hyperlipidemia, unspecified: Secondary | ICD-10-CM | POA: Diagnosis not present

## 2017-11-09 DIAGNOSIS — N186 End stage renal disease: Secondary | ICD-10-CM | POA: Diagnosis not present

## 2017-11-09 DIAGNOSIS — E1165 Type 2 diabetes mellitus with hyperglycemia: Secondary | ICD-10-CM | POA: Diagnosis not present

## 2017-11-09 DIAGNOSIS — I10 Essential (primary) hypertension: Secondary | ICD-10-CM | POA: Diagnosis not present

## 2017-12-01 DIAGNOSIS — Z992 Dependence on renal dialysis: Secondary | ICD-10-CM | POA: Diagnosis not present

## 2017-12-01 DIAGNOSIS — N186 End stage renal disease: Secondary | ICD-10-CM | POA: Diagnosis not present

## 2017-12-03 DIAGNOSIS — D631 Anemia in chronic kidney disease: Secondary | ICD-10-CM | POA: Diagnosis not present

## 2017-12-03 DIAGNOSIS — N186 End stage renal disease: Secondary | ICD-10-CM | POA: Diagnosis not present

## 2017-12-03 DIAGNOSIS — Z992 Dependence on renal dialysis: Secondary | ICD-10-CM | POA: Diagnosis not present

## 2017-12-29 DIAGNOSIS — Z992 Dependence on renal dialysis: Secondary | ICD-10-CM | POA: Diagnosis not present

## 2017-12-29 DIAGNOSIS — N186 End stage renal disease: Secondary | ICD-10-CM | POA: Diagnosis not present

## 2017-12-30 DIAGNOSIS — E113211 Type 2 diabetes mellitus with mild nonproliferative diabetic retinopathy with macular edema, right eye: Secondary | ICD-10-CM | POA: Diagnosis not present

## 2017-12-30 DIAGNOSIS — H401131 Primary open-angle glaucoma, bilateral, mild stage: Secondary | ICD-10-CM | POA: Diagnosis not present

## 2017-12-30 DIAGNOSIS — E113292 Type 2 diabetes mellitus with mild nonproliferative diabetic retinopathy without macular edema, left eye: Secondary | ICD-10-CM | POA: Diagnosis not present

## 2017-12-31 DIAGNOSIS — D631 Anemia in chronic kidney disease: Secondary | ICD-10-CM | POA: Diagnosis not present

## 2017-12-31 DIAGNOSIS — N186 End stage renal disease: Secondary | ICD-10-CM | POA: Diagnosis not present

## 2017-12-31 DIAGNOSIS — Z992 Dependence on renal dialysis: Secondary | ICD-10-CM | POA: Diagnosis not present

## 2018-01-29 DIAGNOSIS — N186 End stage renal disease: Secondary | ICD-10-CM | POA: Diagnosis not present

## 2018-01-29 DIAGNOSIS — Z992 Dependence on renal dialysis: Secondary | ICD-10-CM | POA: Diagnosis not present

## 2018-01-31 DIAGNOSIS — D631 Anemia in chronic kidney disease: Secondary | ICD-10-CM | POA: Diagnosis not present

## 2018-01-31 DIAGNOSIS — N186 End stage renal disease: Secondary | ICD-10-CM | POA: Diagnosis not present

## 2018-01-31 DIAGNOSIS — Z992 Dependence on renal dialysis: Secondary | ICD-10-CM | POA: Diagnosis not present

## 2018-02-06 DIAGNOSIS — N186 End stage renal disease: Secondary | ICD-10-CM | POA: Diagnosis not present

## 2018-02-06 DIAGNOSIS — E1165 Type 2 diabetes mellitus with hyperglycemia: Secondary | ICD-10-CM | POA: Diagnosis not present

## 2018-02-06 DIAGNOSIS — I10 Essential (primary) hypertension: Secondary | ICD-10-CM | POA: Diagnosis not present

## 2018-02-06 DIAGNOSIS — E785 Hyperlipidemia, unspecified: Secondary | ICD-10-CM | POA: Diagnosis not present

## 2018-02-06 DIAGNOSIS — D649 Anemia, unspecified: Secondary | ICD-10-CM | POA: Diagnosis not present

## 2018-02-27 DIAGNOSIS — I739 Peripheral vascular disease, unspecified: Secondary | ICD-10-CM | POA: Diagnosis not present

## 2018-02-27 DIAGNOSIS — L110 Acquired keratosis follicularis: Secondary | ICD-10-CM | POA: Diagnosis not present

## 2018-02-27 DIAGNOSIS — B351 Tinea unguium: Secondary | ICD-10-CM | POA: Diagnosis not present

## 2018-02-28 DIAGNOSIS — Z992 Dependence on renal dialysis: Secondary | ICD-10-CM | POA: Diagnosis not present

## 2018-02-28 DIAGNOSIS — N186 End stage renal disease: Secondary | ICD-10-CM | POA: Diagnosis not present

## 2018-03-02 DIAGNOSIS — D631 Anemia in chronic kidney disease: Secondary | ICD-10-CM | POA: Diagnosis not present

## 2018-03-02 DIAGNOSIS — N186 End stage renal disease: Secondary | ICD-10-CM | POA: Diagnosis not present

## 2018-03-02 DIAGNOSIS — Z992 Dependence on renal dialysis: Secondary | ICD-10-CM | POA: Diagnosis not present

## 2018-03-17 DIAGNOSIS — E113292 Type 2 diabetes mellitus with mild nonproliferative diabetic retinopathy without macular edema, left eye: Secondary | ICD-10-CM | POA: Diagnosis not present

## 2018-03-17 DIAGNOSIS — H401131 Primary open-angle glaucoma, bilateral, mild stage: Secondary | ICD-10-CM | POA: Diagnosis not present

## 2018-03-17 DIAGNOSIS — E113211 Type 2 diabetes mellitus with mild nonproliferative diabetic retinopathy with macular edema, right eye: Secondary | ICD-10-CM | POA: Diagnosis not present

## 2018-03-31 DIAGNOSIS — N186 End stage renal disease: Secondary | ICD-10-CM | POA: Diagnosis not present

## 2018-03-31 DIAGNOSIS — Z992 Dependence on renal dialysis: Secondary | ICD-10-CM | POA: Diagnosis not present

## 2018-04-01 DIAGNOSIS — N186 End stage renal disease: Secondary | ICD-10-CM | POA: Diagnosis not present

## 2018-04-01 DIAGNOSIS — Z992 Dependence on renal dialysis: Secondary | ICD-10-CM | POA: Diagnosis not present

## 2018-04-01 DIAGNOSIS — D631 Anemia in chronic kidney disease: Secondary | ICD-10-CM | POA: Diagnosis not present

## 2018-04-01 DIAGNOSIS — D509 Iron deficiency anemia, unspecified: Secondary | ICD-10-CM | POA: Diagnosis not present

## 2018-04-03 DIAGNOSIS — H5213 Myopia, bilateral: Secondary | ICD-10-CM | POA: Diagnosis not present

## 2018-04-03 DIAGNOSIS — E113311 Type 2 diabetes mellitus with moderate nonproliferative diabetic retinopathy with macular edema, right eye: Secondary | ICD-10-CM | POA: Diagnosis not present

## 2018-04-03 DIAGNOSIS — Z961 Presence of intraocular lens: Secondary | ICD-10-CM | POA: Diagnosis not present

## 2018-04-03 DIAGNOSIS — E113392 Type 2 diabetes mellitus with moderate nonproliferative diabetic retinopathy without macular edema, left eye: Secondary | ICD-10-CM | POA: Diagnosis not present

## 2018-04-30 DIAGNOSIS — Z992 Dependence on renal dialysis: Secondary | ICD-10-CM | POA: Diagnosis not present

## 2018-04-30 DIAGNOSIS — N186 End stage renal disease: Secondary | ICD-10-CM | POA: Diagnosis not present

## 2018-05-02 DIAGNOSIS — Z992 Dependence on renal dialysis: Secondary | ICD-10-CM | POA: Diagnosis not present

## 2018-05-02 DIAGNOSIS — N186 End stage renal disease: Secondary | ICD-10-CM | POA: Diagnosis not present

## 2018-05-02 DIAGNOSIS — D631 Anemia in chronic kidney disease: Secondary | ICD-10-CM | POA: Diagnosis not present

## 2018-05-02 DIAGNOSIS — D509 Iron deficiency anemia, unspecified: Secondary | ICD-10-CM | POA: Diagnosis not present

## 2018-05-08 DIAGNOSIS — M1990 Unspecified osteoarthritis, unspecified site: Secondary | ICD-10-CM | POA: Diagnosis not present

## 2018-05-08 DIAGNOSIS — N186 End stage renal disease: Secondary | ICD-10-CM | POA: Diagnosis not present

## 2018-05-08 DIAGNOSIS — I10 Essential (primary) hypertension: Secondary | ICD-10-CM | POA: Diagnosis not present

## 2018-05-08 DIAGNOSIS — E1165 Type 2 diabetes mellitus with hyperglycemia: Secondary | ICD-10-CM | POA: Diagnosis not present

## 2018-05-10 DIAGNOSIS — H3561 Retinal hemorrhage, right eye: Secondary | ICD-10-CM | POA: Diagnosis not present

## 2018-05-10 DIAGNOSIS — H5213 Myopia, bilateral: Secondary | ICD-10-CM | POA: Diagnosis not present

## 2018-05-10 DIAGNOSIS — E113311 Type 2 diabetes mellitus with moderate nonproliferative diabetic retinopathy with macular edema, right eye: Secondary | ICD-10-CM | POA: Diagnosis not present

## 2018-05-22 DIAGNOSIS — B351 Tinea unguium: Secondary | ICD-10-CM | POA: Diagnosis not present

## 2018-05-22 DIAGNOSIS — L110 Acquired keratosis follicularis: Secondary | ICD-10-CM | POA: Diagnosis not present

## 2018-05-22 DIAGNOSIS — I739 Peripheral vascular disease, unspecified: Secondary | ICD-10-CM | POA: Diagnosis not present

## 2018-05-31 DIAGNOSIS — N186 End stage renal disease: Secondary | ICD-10-CM | POA: Diagnosis not present

## 2018-05-31 DIAGNOSIS — Z992 Dependence on renal dialysis: Secondary | ICD-10-CM | POA: Diagnosis not present

## 2018-06-01 DIAGNOSIS — D509 Iron deficiency anemia, unspecified: Secondary | ICD-10-CM | POA: Diagnosis not present

## 2018-06-01 DIAGNOSIS — D631 Anemia in chronic kidney disease: Secondary | ICD-10-CM | POA: Diagnosis not present

## 2018-06-01 DIAGNOSIS — N186 End stage renal disease: Secondary | ICD-10-CM | POA: Diagnosis not present

## 2018-06-01 DIAGNOSIS — Z992 Dependence on renal dialysis: Secondary | ICD-10-CM | POA: Diagnosis not present

## 2018-06-30 DIAGNOSIS — Z992 Dependence on renal dialysis: Secondary | ICD-10-CM | POA: Diagnosis not present

## 2018-06-30 DIAGNOSIS — N186 End stage renal disease: Secondary | ICD-10-CM | POA: Diagnosis not present

## 2018-07-04 DIAGNOSIS — N186 End stage renal disease: Secondary | ICD-10-CM | POA: Diagnosis not present

## 2018-07-04 DIAGNOSIS — D509 Iron deficiency anemia, unspecified: Secondary | ICD-10-CM | POA: Diagnosis not present

## 2018-07-04 DIAGNOSIS — D631 Anemia in chronic kidney disease: Secondary | ICD-10-CM | POA: Diagnosis not present

## 2018-07-04 DIAGNOSIS — Z23 Encounter for immunization: Secondary | ICD-10-CM | POA: Diagnosis not present

## 2018-07-04 DIAGNOSIS — Z992 Dependence on renal dialysis: Secondary | ICD-10-CM | POA: Diagnosis not present

## 2018-07-05 DIAGNOSIS — E113311 Type 2 diabetes mellitus with moderate nonproliferative diabetic retinopathy with macular edema, right eye: Secondary | ICD-10-CM | POA: Diagnosis not present

## 2018-07-05 DIAGNOSIS — H3561 Retinal hemorrhage, right eye: Secondary | ICD-10-CM | POA: Diagnosis not present

## 2018-07-05 DIAGNOSIS — Z961 Presence of intraocular lens: Secondary | ICD-10-CM | POA: Diagnosis not present

## 2018-07-05 DIAGNOSIS — E113392 Type 2 diabetes mellitus with moderate nonproliferative diabetic retinopathy without macular edema, left eye: Secondary | ICD-10-CM | POA: Diagnosis not present

## 2018-07-06 DIAGNOSIS — E876 Hypokalemia: Secondary | ICD-10-CM | POA: Diagnosis not present

## 2018-07-17 DIAGNOSIS — E113311 Type 2 diabetes mellitus with moderate nonproliferative diabetic retinopathy with macular edema, right eye: Secondary | ICD-10-CM | POA: Diagnosis not present

## 2018-07-21 ENCOUNTER — Ambulatory Visit: Payer: PPO

## 2018-07-28 ENCOUNTER — Ambulatory Visit (INDEPENDENT_AMBULATORY_CARE_PROVIDER_SITE_OTHER): Payer: PPO | Admitting: Physician Assistant

## 2018-07-28 ENCOUNTER — Other Ambulatory Visit: Payer: Self-pay

## 2018-07-28 VITALS — BP 129/63 | HR 81 | Temp 97.7°F | Resp 18 | Ht 72.0 in | Wt 175.0 lb

## 2018-07-28 DIAGNOSIS — N186 End stage renal disease: Secondary | ICD-10-CM | POA: Diagnosis not present

## 2018-07-28 NOTE — Progress Notes (Signed)
Established Dialysis Access   History of Present Illness   Nathaniel Martinez is a 84 y.o. (August 24, 1933) male who presents for re-evaluation of permanent access.  Surgical history is significant for left brachiocephalic fistula creation by Dr. Scot Dock on 04/17/2013 with subsequent outflow venoplasty on 06/11/2013.  The patient states that he has not had any difficulty with his fistula since that time.  He returns to clinic today with large pseudoaneurysmal segments of AV fistula.  He states that he is able to complete his full hemodialysis treatments and has not had any complications during these treatments.  He denies prolonged bleeding.  He is not bothered by any pain to his fistula.  He is not interested in having any surgery for his fistula as he is not bothered by its appearance.  He denies any ulcerations or sores at needlestick sites.  He dialyzes on a Tuesday Thursday Saturday under the management of Dr. Lowanda Foster at [facility] dialysis center in [place].   Current Outpatient Medications  Medication Sig Dispense Refill  . calcium carbonate (TUMS EX) 750 MG chewable tablet Chew 1 tablet by mouth 3 (three) times daily.    . Cholecalciferol (VITAMIN D3) 1000 UNITS CAPS Take 1,000 capsules by mouth daily.    . furosemide (LASIX) 40 MG tablet Take 40 mg by mouth.    . simvastatin (ZOCOR) 20 MG tablet Take 20 mg by mouth every evening.    Marland Kitchen amLODipine (NORVASC) 2.5 MG tablet Take 2.5 mg by mouth daily.    Marland Kitchen glipiZIDE (GLUCOTROL XL) 5 MG 24 hr tablet Take 5 mg by mouth daily.    Marland Kitchen linagliptin (TRADJENTA) 5 MG TABS tablet Take 5 mg by mouth daily.    Marland Kitchen losartan (COZAAR) 50 MG tablet Take 75 mg by mouth daily.     . Nepafenac 0.3 % SUSP Apply 1 drop to eye.    . sitaGLIPtin (JANUVIA) 25 MG tablet Take 25 mg by mouth daily.     No current facility-administered medications for this visit.      Physical Examination   Vitals:   07/28/18 1250  BP: 129/63  Pulse: 81  Resp: 18  Temp: 97.7 F (36.5 C)   TempSrc: Oral  SpO2: 97%  Weight: 175 lb (79.4 kg)  Height: 6' (1.829 m)   Body mass index is 23.73 kg/m.  General Alert, O x 3, WD, NAD  Pulmonary Sym exp, good B air movt,   Cardiac RRR, Nl S1, S2,   Vascular Vessel Right Left  Radial Palpable Palpable  Brachial Palpable Palpable  Ulnar Not palpable Not palpable    Musculo- skeletal  palpable thrill and audible bruit throughout length of AV fistula in left upper arm; large pseudoaneurysmal segment in mid upper arm; no overlying ulcerations or evidence of compromised skin; fistula mobile under skin if pinched in all segments  Neurologic A&O; CN grossly intact     Medical Decision Making   Nathaniel Martinez is a 84 y.o. male who presents with ESRD requiring hemodialysis.    Patent fistula with palpable thrill from [facility] fossa extending to segment of fistula near axilla; Despite generalized pseudoaneurysmal fistula, it is unlikely there is  any significant outflow stenosis given strength of thrill at level of axilla  Plication of fistula was discussed with the patient and his wife during today's appointment  The patient is uninterested in any surgeries given his age and activity level especially if there is any risk for need for tunneled dialysis catheter  There are  no areas of skin thinning or ulcerations that would suggest that the patient is at any significant risk for bleeding from fistula at this time however I explained to the patient that the the pseudoaneurysmal segments will likely continue to get larger  I encouraged the patient and his wife to discuss changing cannulation sites with the dialysis center  The patient and his wife agreed to try to rotate cannulation sites and will return to office to discuss plication further with a surgeon if they are not satisfied  Dagoberto Ligas PA-C [facility]: [phone]

## 2018-07-31 DIAGNOSIS — N186 End stage renal disease: Secondary | ICD-10-CM | POA: Diagnosis not present

## 2018-07-31 DIAGNOSIS — Z992 Dependence on renal dialysis: Secondary | ICD-10-CM | POA: Diagnosis not present

## 2018-08-01 DIAGNOSIS — N186 End stage renal disease: Secondary | ICD-10-CM | POA: Diagnosis not present

## 2018-08-01 DIAGNOSIS — Z992 Dependence on renal dialysis: Secondary | ICD-10-CM | POA: Diagnosis not present

## 2018-08-01 DIAGNOSIS — D631 Anemia in chronic kidney disease: Secondary | ICD-10-CM | POA: Diagnosis not present

## 2018-08-07 DIAGNOSIS — H9190 Unspecified hearing loss, unspecified ear: Secondary | ICD-10-CM | POA: Diagnosis not present

## 2018-08-07 DIAGNOSIS — E1165 Type 2 diabetes mellitus with hyperglycemia: Secondary | ICD-10-CM | POA: Diagnosis not present

## 2018-08-07 DIAGNOSIS — I10 Essential (primary) hypertension: Secondary | ICD-10-CM | POA: Diagnosis not present

## 2018-08-07 DIAGNOSIS — N186 End stage renal disease: Secondary | ICD-10-CM | POA: Diagnosis not present

## 2018-08-07 DIAGNOSIS — Z23 Encounter for immunization: Secondary | ICD-10-CM | POA: Diagnosis not present

## 2018-08-14 DIAGNOSIS — E113311 Type 2 diabetes mellitus with moderate nonproliferative diabetic retinopathy with macular edema, right eye: Secondary | ICD-10-CM | POA: Diagnosis not present

## 2018-08-14 DIAGNOSIS — H3561 Retinal hemorrhage, right eye: Secondary | ICD-10-CM | POA: Diagnosis not present

## 2018-08-14 DIAGNOSIS — E113392 Type 2 diabetes mellitus with moderate nonproliferative diabetic retinopathy without macular edema, left eye: Secondary | ICD-10-CM | POA: Diagnosis not present

## 2018-08-21 DIAGNOSIS — L110 Acquired keratosis follicularis: Secondary | ICD-10-CM | POA: Diagnosis not present

## 2018-08-21 DIAGNOSIS — B351 Tinea unguium: Secondary | ICD-10-CM | POA: Diagnosis not present

## 2018-08-21 DIAGNOSIS — I739 Peripheral vascular disease, unspecified: Secondary | ICD-10-CM | POA: Diagnosis not present

## 2018-08-31 DIAGNOSIS — N186 End stage renal disease: Secondary | ICD-10-CM | POA: Diagnosis not present

## 2018-08-31 DIAGNOSIS — Z992 Dependence on renal dialysis: Secondary | ICD-10-CM | POA: Diagnosis not present

## 2018-09-02 DIAGNOSIS — N186 End stage renal disease: Secondary | ICD-10-CM | POA: Diagnosis not present

## 2018-09-02 DIAGNOSIS — Z992 Dependence on renal dialysis: Secondary | ICD-10-CM | POA: Diagnosis not present

## 2018-09-02 DIAGNOSIS — D631 Anemia in chronic kidney disease: Secondary | ICD-10-CM | POA: Diagnosis not present

## 2018-09-02 DIAGNOSIS — D509 Iron deficiency anemia, unspecified: Secondary | ICD-10-CM | POA: Diagnosis not present

## 2018-09-22 DIAGNOSIS — E118 Type 2 diabetes mellitus with unspecified complications: Secondary | ICD-10-CM | POA: Diagnosis not present

## 2018-09-22 DIAGNOSIS — E113211 Type 2 diabetes mellitus with mild nonproliferative diabetic retinopathy with macular edema, right eye: Secondary | ICD-10-CM | POA: Diagnosis not present

## 2018-09-22 DIAGNOSIS — E113292 Type 2 diabetes mellitus with mild nonproliferative diabetic retinopathy without macular edema, left eye: Secondary | ICD-10-CM | POA: Diagnosis not present

## 2018-09-22 DIAGNOSIS — H401131 Primary open-angle glaucoma, bilateral, mild stage: Secondary | ICD-10-CM | POA: Diagnosis not present

## 2018-09-25 DIAGNOSIS — E113392 Type 2 diabetes mellitus with moderate nonproliferative diabetic retinopathy without macular edema, left eye: Secondary | ICD-10-CM | POA: Diagnosis not present

## 2018-09-25 DIAGNOSIS — E113311 Type 2 diabetes mellitus with moderate nonproliferative diabetic retinopathy with macular edema, right eye: Secondary | ICD-10-CM | POA: Diagnosis not present

## 2018-09-25 DIAGNOSIS — Z09 Encounter for follow-up examination after completed treatment for conditions other than malignant neoplasm: Secondary | ICD-10-CM | POA: Diagnosis not present

## 2018-09-30 DIAGNOSIS — N186 End stage renal disease: Secondary | ICD-10-CM | POA: Diagnosis not present

## 2018-09-30 DIAGNOSIS — Z992 Dependence on renal dialysis: Secondary | ICD-10-CM | POA: Diagnosis not present

## 2018-10-03 DIAGNOSIS — Z992 Dependence on renal dialysis: Secondary | ICD-10-CM | POA: Diagnosis not present

## 2018-10-03 DIAGNOSIS — D509 Iron deficiency anemia, unspecified: Secondary | ICD-10-CM | POA: Diagnosis not present

## 2018-10-03 DIAGNOSIS — D631 Anemia in chronic kidney disease: Secondary | ICD-10-CM | POA: Diagnosis not present

## 2018-10-03 DIAGNOSIS — N186 End stage renal disease: Secondary | ICD-10-CM | POA: Diagnosis not present

## 2018-10-31 DIAGNOSIS — Z992 Dependence on renal dialysis: Secondary | ICD-10-CM | POA: Diagnosis not present

## 2018-10-31 DIAGNOSIS — N186 End stage renal disease: Secondary | ICD-10-CM | POA: Diagnosis not present

## 2018-11-02 DIAGNOSIS — D509 Iron deficiency anemia, unspecified: Secondary | ICD-10-CM | POA: Diagnosis not present

## 2018-11-02 DIAGNOSIS — N186 End stage renal disease: Secondary | ICD-10-CM | POA: Diagnosis not present

## 2018-11-02 DIAGNOSIS — Z992 Dependence on renal dialysis: Secondary | ICD-10-CM | POA: Diagnosis not present

## 2018-11-02 DIAGNOSIS — D631 Anemia in chronic kidney disease: Secondary | ICD-10-CM | POA: Diagnosis not present

## 2018-11-06 DIAGNOSIS — Z961 Presence of intraocular lens: Secondary | ICD-10-CM | POA: Diagnosis not present

## 2018-11-06 DIAGNOSIS — H5213 Myopia, bilateral: Secondary | ICD-10-CM | POA: Diagnosis not present

## 2018-11-06 DIAGNOSIS — E113311 Type 2 diabetes mellitus with moderate nonproliferative diabetic retinopathy with macular edema, right eye: Secondary | ICD-10-CM | POA: Diagnosis not present

## 2018-11-06 DIAGNOSIS — E113392 Type 2 diabetes mellitus with moderate nonproliferative diabetic retinopathy without macular edema, left eye: Secondary | ICD-10-CM | POA: Diagnosis not present

## 2018-11-20 DIAGNOSIS — I739 Peripheral vascular disease, unspecified: Secondary | ICD-10-CM | POA: Diagnosis not present

## 2018-11-20 DIAGNOSIS — B351 Tinea unguium: Secondary | ICD-10-CM | POA: Diagnosis not present

## 2018-11-20 DIAGNOSIS — L110 Acquired keratosis follicularis: Secondary | ICD-10-CM | POA: Diagnosis not present

## 2018-12-01 DIAGNOSIS — Z992 Dependence on renal dialysis: Secondary | ICD-10-CM | POA: Diagnosis not present

## 2018-12-01 DIAGNOSIS — N186 End stage renal disease: Secondary | ICD-10-CM | POA: Diagnosis not present

## 2018-12-02 DIAGNOSIS — N186 End stage renal disease: Secondary | ICD-10-CM | POA: Diagnosis not present

## 2018-12-02 DIAGNOSIS — Z992 Dependence on renal dialysis: Secondary | ICD-10-CM | POA: Diagnosis not present

## 2018-12-02 DIAGNOSIS — D509 Iron deficiency anemia, unspecified: Secondary | ICD-10-CM | POA: Diagnosis not present

## 2018-12-02 DIAGNOSIS — D631 Anemia in chronic kidney disease: Secondary | ICD-10-CM | POA: Diagnosis not present

## 2018-12-13 DIAGNOSIS — N186 End stage renal disease: Secondary | ICD-10-CM | POA: Diagnosis not present

## 2018-12-13 DIAGNOSIS — M1990 Unspecified osteoarthritis, unspecified site: Secondary | ICD-10-CM | POA: Diagnosis not present

## 2018-12-13 DIAGNOSIS — I10 Essential (primary) hypertension: Secondary | ICD-10-CM | POA: Diagnosis not present

## 2018-12-13 DIAGNOSIS — E1165 Type 2 diabetes mellitus with hyperglycemia: Secondary | ICD-10-CM | POA: Diagnosis not present

## 2018-12-30 DIAGNOSIS — N186 End stage renal disease: Secondary | ICD-10-CM | POA: Diagnosis not present

## 2018-12-30 DIAGNOSIS — Z992 Dependence on renal dialysis: Secondary | ICD-10-CM | POA: Diagnosis not present

## 2019-01-02 DIAGNOSIS — D509 Iron deficiency anemia, unspecified: Secondary | ICD-10-CM | POA: Diagnosis not present

## 2019-01-02 DIAGNOSIS — N186 End stage renal disease: Secondary | ICD-10-CM | POA: Diagnosis not present

## 2019-01-02 DIAGNOSIS — D631 Anemia in chronic kidney disease: Secondary | ICD-10-CM | POA: Diagnosis not present

## 2019-01-02 DIAGNOSIS — Z992 Dependence on renal dialysis: Secondary | ICD-10-CM | POA: Diagnosis not present

## 2019-01-29 ENCOUNTER — Ambulatory Visit (HOSPITAL_COMMUNITY)
Admission: RE | Admit: 2019-01-29 | Discharge: 2019-01-29 | Disposition: A | Discharge status: Home / Self Care | Payer: PPO | Source: Ambulatory Visit | Attending: Nephrology | Admitting: Nephrology

## 2019-01-29 ENCOUNTER — Other Ambulatory Visit: Payer: Self-pay | Admitting: Student

## 2019-01-29 ENCOUNTER — Encounter (HOSPITAL_COMMUNITY): Payer: Self-pay

## 2019-01-29 ENCOUNTER — Other Ambulatory Visit (HOSPITAL_COMMUNITY): Payer: Self-pay | Admitting: Nephrology

## 2019-01-29 ENCOUNTER — Other Ambulatory Visit: Payer: Self-pay

## 2019-01-29 DIAGNOSIS — N186 End stage renal disease: Secondary | ICD-10-CM | POA: Diagnosis not present

## 2019-01-29 DIAGNOSIS — I120 Hypertensive chronic kidney disease with stage 5 chronic kidney disease or end stage renal disease: Secondary | ICD-10-CM | POA: Diagnosis not present

## 2019-01-29 DIAGNOSIS — D649 Anemia, unspecified: Secondary | ICD-10-CM | POA: Diagnosis not present

## 2019-01-29 DIAGNOSIS — Z87891 Personal history of nicotine dependence: Secondary | ICD-10-CM | POA: Diagnosis not present

## 2019-01-29 DIAGNOSIS — E1122 Type 2 diabetes mellitus with diabetic chronic kidney disease: Secondary | ICD-10-CM | POA: Insufficient documentation

## 2019-01-29 DIAGNOSIS — K219 Gastro-esophageal reflux disease without esophagitis: Secondary | ICD-10-CM | POA: Insufficient documentation

## 2019-01-29 DIAGNOSIS — T82898A Other specified complication of vascular prosthetic devices, implants and grafts, initial encounter: Secondary | ICD-10-CM | POA: Diagnosis not present

## 2019-01-29 DIAGNOSIS — E785 Hyperlipidemia, unspecified: Secondary | ICD-10-CM | POA: Diagnosis not present

## 2019-01-29 DIAGNOSIS — Z7984 Long term (current) use of oral hypoglycemic drugs: Secondary | ICD-10-CM | POA: Diagnosis not present

## 2019-01-29 DIAGNOSIS — H409 Unspecified glaucoma: Secondary | ICD-10-CM | POA: Diagnosis not present

## 2019-01-29 DIAGNOSIS — Z79899 Other long term (current) drug therapy: Secondary | ICD-10-CM | POA: Insufficient documentation

## 2019-01-29 DIAGNOSIS — E1151 Type 2 diabetes mellitus with diabetic peripheral angiopathy without gangrene: Secondary | ICD-10-CM | POA: Insufficient documentation

## 2019-01-29 DIAGNOSIS — Y841 Kidney dialysis as the cause of abnormal reaction of the patient, or of later complication, without mention of misadventure at the time of the procedure: Secondary | ICD-10-CM | POA: Insufficient documentation

## 2019-01-29 DIAGNOSIS — T82510A Breakdown (mechanical) of surgically created arteriovenous fistula, initial encounter: Secondary | ICD-10-CM | POA: Insufficient documentation

## 2019-01-29 DIAGNOSIS — T82868A Thrombosis of vascular prosthetic devices, implants and grafts, initial encounter: Secondary | ICD-10-CM | POA: Diagnosis not present

## 2019-01-29 HISTORY — PX: IR DIALY SHUNT INTRO NEEDLE/INTRACATH INITIAL W/IMG LEFT: IMG6102

## 2019-01-29 LAB — BASIC METABOLIC PANEL
Anion gap: 8 (ref 5–15)
BUN: 41 mg/dL — ABNORMAL HIGH (ref 8–23)
CALCIUM: 8.4 mg/dL — AB (ref 8.9–10.3)
CO2: 28 mmol/L (ref 22–32)
Chloride: 99 mmol/L (ref 98–111)
Creatinine, Ser: 6.09 mg/dL — ABNORMAL HIGH (ref 0.61–1.24)
GFR calc Af Amer: 9 mL/min — ABNORMAL LOW (ref >60)
GFR, EST NON AFRICAN AMERICAN: 8 mL/min — AB (ref >60)
Glucose, Bld: 125 mg/dL — ABNORMAL HIGH (ref 70–99)
Potassium: 3.8 mmol/L (ref 3.5–5.1)
Sodium: 135 mmol/L (ref 135–145)

## 2019-01-29 LAB — PROTIME-INR
INR: 1 (ref 0.8–1.2)
Prothrombin Time: 13.3 seconds (ref 11.4–15.2)

## 2019-01-29 LAB — APTT: APTT: 32 s (ref 24–36)

## 2019-01-29 MED ORDER — SODIUM CHLORIDE 0.9 % IV SOLN: INTRAVENOUS | Status: DC

## 2019-01-29 MED ORDER — IOHEXOL 300 MG/ML  SOLN
100.0000 mL | Freq: Once | INTRAMUSCULAR | Status: AC | PRN
  Administered 2019-01-29: 30 mL via INTRAVENOUS

## 2019-01-29 MED ORDER — LIDOCAINE HCL 1 % IJ SOLN
INTRAMUSCULAR | Status: AC
  Filled 2019-01-29: qty 20

## 2019-01-29 NOTE — Procedures (Signed)
Pre-procedure Diagnosis: ESRD Post-procedure Diagnosis: Same  Normally functioning left upper arm AV fistula without peripheral or central stenosis.  No intervention performed.  No immediate post procedural complication.   Complications: None Immediate  EBL: Minimal   Ronny Bacon, MD Pager #: [phone]

## 2019-01-29 NOTE — H&P (Signed)
Chief Complaint: Patient was seen in consultation today for left arm dialysis fistula evaluation and possible intervention at the request of [place]   Supervising Physician: Sandi Mariscal  Patient Status: [facility] - Out-pt  History of Present Illness: Nathaniel Martinez is a 85 y.o. male   ESRD Was placed 2014 per pt-- no issues with this at all Left arm fistula clotted Dialysis was complete on Thursday Saturday-- ?clotted----arm was painful So stopped dialysis  Pt now scheduled today for evaluation and possible intervention Thrombolysis with possible angioplasty/stent placement Possible tunneled dialysis catheter placement if needed Denies recent CVA; no surgeries; no thinners  Fistula has good pulse and thrill today  Past Medical History:  Diagnosis Date  . Anemia   . Chronic kidney disease   . Diabetes mellitus without complication ([place])    takes Glipizide daily and Januvia  . Fistula    left arm  . Gastric ulcer    hx of;several yrs ago  . GERD (gastroesophageal reflux disease)    takes OTC meds  . Glaucoma    uses eye drops prn  . History of blood transfusion    no abnormal reaction noted  . History of bronchitis    several yrs ago  . Hyperlipidemia    takes Simvastatin daily  . Hypertension    takes Losartan daily  . Peripheral neuropathy   . Proteinuria   . Urinary frequency     Past Surgical History:  Procedure Laterality Date  . AV FISTULA PLACEMENT Left 04/17/2013   Procedure: ARTERIOVENOUS (AV) FISTULA CREATION;  Surgeon: Angelia Mould, MD;  Location: [facility] OR;  Service: Vascular;  Laterality: Left;  Left arm brachiocephalic arteriovneous fistula  . COLONOSCOPY    . ESOPHAGOGASTRODUODENOSCOPY    . FISTULOGRAM Left 06/11/2013   Procedure: FISTULOGRAM;  Surgeon: Angelia Mould, MD;  Location[facility] CATH LAB;  Service: Cardiovascular;  Laterality: Left;  . vasectomy      Allergies: Penicillins  Medications: Prior to Admission  medications   Medication Sig Start Date End Date Taking? Authorizing Provider  amLODipine (NORVASC) 2.5 MG tablet Take 2.5 mg by mouth daily.   Yes [provider]  calcium carbonate (TUMS EX) 750 MG chewable tablet Chew 1 tablet by mouth 3 (three) times daily.   Yes [provider]  Cholecalciferol (VITAMIN D3) 1000 UNITS CAPS Take 1,000 capsules by mouth daily.   Yes [provider]  furosemide (LASIX) 40 MG tablet Take 40 mg by mouth.   Yes [provider]  glipiZIDE (GLUCOTROL XL) 5 MG 24 hr tablet Take 5 mg by mouth daily.   Yes [provider]  linagliptin (TRADJENTA) 5 MG TABS tablet Take 5 mg by mouth daily.   Yes [provider]  losartan (COZAAR) 50 MG tablet Take 75 mg by mouth daily.    Yes [provider]  Nepafenac 0.3 % SUSP Apply 1 drop to eye.   Yes [provider]  simvastatin (ZOCOR) 20 MG tablet Take 20 mg by mouth every evening.   Yes [provider]  sitaGLIPtin (JANUVIA) 25 MG tablet Take 25 mg by mouth daily.   Yes [provider]     Family History  Problem Relation Age of Onset  . Cancer Mother   . Cancer Brother   . Diabetes Brother   . Heart attack Brother   . Other Brother        amputation    Social History   Socioeconomic History  . Marital  status: Married    Spouse name: Not on file  . Number of children: Not on file  . Years of education: Not on file  . Highest education level: Not on file  Occupational History  . Not on file  Social Needs  . Financial resource strain: Not on file  . Food insecurity:    Worry: Not on file    Inability: Not on file  . Transportation needs:    Medical: Not on file    Non-medical: Not on file  Tobacco Use  . Smoking status: Former Smoker    Types: Cigarettes    Last attempt to quit: 11/01/1966    Years since quitting: 52.2  . Smokeless tobacco: Never Used  Substance and Sexual Activity  . Alcohol use: No  . Drug use:  No  . Sexual activity: Not Currently  Lifestyle  . Physical activity:    Days per week: Not on file    Minutes per session: Not on file  . Stress: Not on file  Relationships  . Social connections:    Talks on phone: Not on file    Gets together: Not on file    Attends religious service: Not on file    Active member of club or organization: Not on file    Attends meetings of clubs or organizations: Not on file    Relationship status: Not on file  Other Topics Concern  . Not on file  Social History Narrative  . Not on file    Review of Systems: A 12 point ROS discussed and pertinent positives are indicated in the HPI above.  All other systems are negative.  Review of Systems  Constitutional: Negative for activity change, appetite change, fatigue and fever.  HENT: Positive for hearing loss.   Respiratory: Negative for cough and shortness of breath.   Cardiovascular: Negative for chest pain.  Psychiatric/Behavioral: Negative for behavioral problems and confusion.    Vital Signs: BP (!) 156/83   Pulse 61   Temp (!) 97.4 F (36.3 C) (Oral)   Resp 20   Ht 6' (1.829 m)   Wt 176 lb (79.8 kg)   SpO2 99%   BMI 23.87 kg/m   Physical Exam Vitals signs reviewed.  Cardiovascular:     Rate and Rhythm: Regular rhythm.     Heart sounds: Murmur present.  Pulmonary:     Effort: Pulmonary effort is normal.     Breath sounds: Normal breath sounds.  Abdominal:     General: Bowel sounds are normal.  Musculoskeletal: Normal range of motion.     Comments: Left upper arm fistula + pulse; + thrill  Skin:    General: Skin is warm and dry.  Neurological:     Mental Status: He is alert and oriented to person, place, and time.  Psychiatric:        Mood and Affect: Mood normal.        Behavior: Behavior normal.        Thought Content: Thought content normal.        Judgment: Judgment normal.     Imaging: No results found.  Labs:  CBC: No results for input(s): WBC, HGB, HCT,  PLT in the last 8760 hours.  COAGS: No results for input(s): INR, APTT in the last 8760 hours.  BMP: No results for input(s): NA, K, CL, CO2, GLUCOSE, BUN, CALCIUM, CREATININE, GFRNONAA, GFRAA in the last 8760 hours.  Invalid input(s): CMP  LIVER FUNCTION TESTS: No results for input(s):  BILITOT, AST, ALT, ALKPHOS, PROT, ALBUMIN in the last 8760 hours.  TUMOR MARKERS: No results for input(s): AFPTM, CEA, CA199, CHROMGRNA in the last 8760 hours.  Assessment and Plan:  ESRD Clotted left arm fistula in dialysis Saturday/ arm pain Now scheduled for fistulogram with possible intervention Possible dialysis catheter placement Risks and benefits discussed with the patient including, but not limited to bleeding, infection, vascular injury, pulmonary embolism, need for tunneled HD catheter placement or even death.  All of the patient's questions were answered, patient is agreeable to proceed. Consent signed and in chart.  Thank you for this interesting consult.  I greatly enjoyed meeting Nathaniel Martinez and look forward to participating in their care.  A copy of this report was sent to the requesting provider on this date.  Electronically Signed: Lavonia Drafts, PA-C 01/29/2019, 12:04 PM   I spent a total of  30 Minutes   in face to face in clinical consultation, greater than 50% of which was counseling/coordinating care for left arm fistulogram with possible intervention; possible dialysis catheter placement

## 2019-01-29 NOTE — Progress Notes (Signed)
Patient D/C home. No medications given by this RN.

## 2019-01-29 NOTE — Discharge Instructions (Signed)
Dialysis Fistulogram ° °A dialysis fistulogram is an X-ray test to look inside the site where blood is removed and returned to your body during dialysis. °Fistulas and grafts provide easy and effective access for dialysis. However, sometimes problems can occur. The fistula or graft can become clogged or narrow. This can make dialysis less effective. You may need to have a fistulogram to check for these problems. If a problem is found, your health care provider can do treatments during the procedure to clear the blocked or narrowed areas. Treatments to open a blocked dialysis fistula or graft may include: °· Removing the clot from the fistula or graft with a device (thrombectomy). °· A procedure to open or widen the blocked area with a balloon (angioplasty). °· Placing a small mesh tube (stent) to keep the fistula or graft open. °· Injecting a medicine to dissolve the clot (thrombolysis). °Tell a health care provider about: °· Any allergies you have, including any reactions you have had to contrast dye. °· All medicines you are taking, including vitamins, herbs, eye drops, creams, and over-the-counter medicines. °· Any problems you or family members have had with anesthetic medicines. °· Any blood disorders you have. °· Any surgeries you have had. °· Any medical conditions you have. °· Whether you are pregnant or may be pregnant. °· Any pain, redness, or swelling you have in your limb that has the dialysis fistula or graft. °· Any bleeding from your dialysis fistula or graft. °· Any of the following in the part of your body where the dialysis fistula or graft leads: °? Numbness. °? Tingling. °? A cold feeling. °? Areas that are bluish or pale white in color. °What are the risks? °Generally, this is a safe procedure. However, problems may occur, including: °· Infection. °· Bleeding. °· Allergic reactions to medicines or dyes. °· Damage to other structures, such as nearby nerves or blood vessels. °· A blood clot in the  dialysis fistula or graft. °· A blood clot that travels to your lungs (pulmonary embolism). °What happens before the procedure? °General instructions °· Follow instructions from your health care provider about eating and drinking restrictions. °· Ask your health care provider about: °? Changing or stopping your regular medicines. This is especially important if you are taking diabetes medicines or blood thinners (anticoagulants). °? Taking medicines such as aspirin and ibuprofen. These medicines can thin your blood. Do not take these medicines unless your health care provider tells you to take them. °? Taking over-the-counter medicines, vitamins, herbs, and supplements. °· Plan to have someone take you home from the hospital or clinic. °· Plan to have a responsible adult care for you for at least 24 hours after you leave the hospital or clinic. This is important. °· You may have a blood or urine sample taken. These will help your health care provider learn how well your kidneys and liver are working and how well your blood clots. °What happens during the procedure? °· An IV will be inserted into one of your veins. °· You will be given one or more of the following: °? A medicine to help you relax (sedative). °? A medicine to numb the area (local anesthetic) on the limb with your fistula or graft. °· A needle will be inserted into your vein. °· A small, thin tube (catheter) will be inserted into the needle and guided to the area of possible problems. °· Dye (contrast material) will be injected into the catheter. As the contrast material passes through   your body, you may feel warm.  X-rays will be taken. The contrast material will show where any narrowing or blockages are.  If narrowing or a blockage is seen, the health care provider may do one of the following to open the blockage: ? Thrombectomy. The health care provider will use a mechanical device at the end of the catheter to break up the  clot. ? Angioplasty. The health care provider will advance a guide wire and balloon-tipped catheter to the blockage site. The balloon will be inflated for a short period of time. A stent may also be placed to keep the blocked area open. ? Thrombolysis. The health care provider will deliver a clot-dissolving medicine through the catheter.  When the procedure is complete, the catheter will be removed.  In some cases, the catheter site will be closed with stitches (sutures).  Pressure will be applied to stop any bleeding, and your skin will be covered with a bandage (dressing). The procedure may vary among health care providers and hospitals. What happens after the procedure?  Your blood pressure, heart rate, breathing rate, and blood oxygen level will be monitored until you leave the hospital or clinic. Your health care provider will also monitor your access site.  You will need to stay in bed for several hours.  Do not drive for 24 hours if you were given a sedative during your procedure. Summary  A dialysis fistulogram is an X-ray test to look inside the site where blood is removed and returned to your body during dialysis.  If a problem is found, your health care provider can also do treatments during the procedure to clear the blocked or narrowed areas. This information is not intended to replace advice given to you by your health care provider. Make sure you discuss any questions you have with your health care provider. Document Released: 03/04/2014 Document Revised: 11/18/2017 Document Reviewed: 11/18/2017 Elsevier Interactive Patient Education  2019 [place]. Moderate Conscious Sedation, Adult, Care After These instructions provide you with information about caring for yourself after your procedure. Your health care provider may also give you more specific instructions. Your treatment has been planned according to current medical practices, but problems sometimes occur. Call your  health care provider if you have any problems or questions after your procedure. What can I expect after the procedure? After your procedure, it is common:  To feel sleepy for several hours.  To feel clumsy and have poor balance for several hours.  To have poor judgment for several hours.  To vomit if you eat too soon. Follow these instructions at home: For at least 24 hours after the procedure:   Do not: ? Participate in activities where you could fall or become injured. ? Drive. ? Use heavy machinery. ? Drink alcohol. ? Take sleeping pills or medicines that cause drowsiness. ? Make important decisions or sign legal documents. ? Take care of children on your own.  Rest. Eating and drinking  Follow the diet recommended by your health care provider.  If you vomit: ? Drink water, juice, or soup when you can drink without vomiting. ? Make sure you have little or no nausea before eating solid foods. General instructions  Have a responsible adult stay with you until you are awake and alert.  Take over-the-counter and prescription medicines only as told by your health care provider.  If you smoke, do not smoke without supervision.  Keep all follow-up visits as told by your health care provider. This is  important. Contact a health care provider if:  You keep feeling nauseous or you keep vomiting.  You feel light-headed.  You develop a rash.  You have a fever. Get help right away if:  You have trouble breathing. This information is not intended to replace advice given to you by your health care provider. Make sure you discuss any questions you have with your health care provider. Document Released: 08/08/2013 Document Revised: 03/22/2016 Document Reviewed: 02/07/2016 Elsevier Interactive Patient Education  2019 [employer].

## 2019-01-30 ENCOUNTER — Encounter (HOSPITAL_COMMUNITY): Payer: Self-pay

## 2019-01-30 ENCOUNTER — Other Ambulatory Visit (HOSPITAL_COMMUNITY): Payer: Self-pay | Admitting: Nephrology

## 2019-01-30 DIAGNOSIS — N186 End stage renal disease: Secondary | ICD-10-CM

## 2019-01-30 DIAGNOSIS — Z992 Dependence on renal dialysis: Secondary | ICD-10-CM | POA: Diagnosis not present

## 2019-02-01 DIAGNOSIS — D631 Anemia in chronic kidney disease: Secondary | ICD-10-CM | POA: Diagnosis not present

## 2019-02-01 DIAGNOSIS — D509 Iron deficiency anemia, unspecified: Secondary | ICD-10-CM | POA: Diagnosis not present

## 2019-02-01 DIAGNOSIS — Z992 Dependence on renal dialysis: Secondary | ICD-10-CM | POA: Diagnosis not present

## 2019-02-01 DIAGNOSIS — N186 End stage renal disease: Secondary | ICD-10-CM | POA: Diagnosis not present

## 2019-03-01 DIAGNOSIS — N186 End stage renal disease: Secondary | ICD-10-CM | POA: Diagnosis not present

## 2019-03-01 DIAGNOSIS — Z992 Dependence on renal dialysis: Secondary | ICD-10-CM | POA: Diagnosis not present

## 2019-03-03 DIAGNOSIS — D509 Iron deficiency anemia, unspecified: Secondary | ICD-10-CM | POA: Diagnosis not present

## 2019-03-03 DIAGNOSIS — D631 Anemia in chronic kidney disease: Secondary | ICD-10-CM | POA: Diagnosis not present

## 2019-03-03 DIAGNOSIS — N186 End stage renal disease: Secondary | ICD-10-CM | POA: Diagnosis not present

## 2019-03-03 DIAGNOSIS — Z992 Dependence on renal dialysis: Secondary | ICD-10-CM | POA: Diagnosis not present

## 2019-03-14 DIAGNOSIS — N186 End stage renal disease: Secondary | ICD-10-CM | POA: Diagnosis not present

## 2019-03-14 DIAGNOSIS — E785 Hyperlipidemia, unspecified: Secondary | ICD-10-CM | POA: Diagnosis not present

## 2019-03-14 DIAGNOSIS — I10 Essential (primary) hypertension: Secondary | ICD-10-CM | POA: Diagnosis not present

## 2019-03-14 DIAGNOSIS — E1165 Type 2 diabetes mellitus with hyperglycemia: Secondary | ICD-10-CM | POA: Diagnosis not present

## 2019-04-01 DIAGNOSIS — N186 End stage renal disease: Secondary | ICD-10-CM | POA: Diagnosis not present

## 2019-04-01 DIAGNOSIS — Z992 Dependence on renal dialysis: Secondary | ICD-10-CM | POA: Diagnosis not present

## 2019-04-02 DIAGNOSIS — E113311 Type 2 diabetes mellitus with moderate nonproliferative diabetic retinopathy with macular edema, right eye: Secondary | ICD-10-CM | POA: Diagnosis not present

## 2019-04-02 DIAGNOSIS — H43811 Vitreous degeneration, right eye: Secondary | ICD-10-CM | POA: Diagnosis not present

## 2019-04-02 DIAGNOSIS — E113392 Type 2 diabetes mellitus with moderate nonproliferative diabetic retinopathy without macular edema, left eye: Secondary | ICD-10-CM | POA: Diagnosis not present

## 2019-04-02 DIAGNOSIS — H5213 Myopia, bilateral: Secondary | ICD-10-CM | POA: Diagnosis not present

## 2019-04-03 DIAGNOSIS — D509 Iron deficiency anemia, unspecified: Secondary | ICD-10-CM | POA: Diagnosis not present

## 2019-04-03 DIAGNOSIS — Z992 Dependence on renal dialysis: Secondary | ICD-10-CM | POA: Diagnosis not present

## 2019-04-03 DIAGNOSIS — N186 End stage renal disease: Secondary | ICD-10-CM | POA: Diagnosis not present

## 2019-04-03 DIAGNOSIS — D631 Anemia in chronic kidney disease: Secondary | ICD-10-CM | POA: Diagnosis not present

## 2019-05-01 DIAGNOSIS — Z992 Dependence on renal dialysis: Secondary | ICD-10-CM | POA: Diagnosis not present

## 2019-05-01 DIAGNOSIS — N186 End stage renal disease: Secondary | ICD-10-CM | POA: Diagnosis not present

## 2019-05-03 DIAGNOSIS — D631 Anemia in chronic kidney disease: Secondary | ICD-10-CM | POA: Diagnosis not present

## 2019-05-03 DIAGNOSIS — N186 End stage renal disease: Secondary | ICD-10-CM | POA: Diagnosis not present

## 2019-05-03 DIAGNOSIS — D509 Iron deficiency anemia, unspecified: Secondary | ICD-10-CM | POA: Diagnosis not present

## 2019-05-03 DIAGNOSIS — Z992 Dependence on renal dialysis: Secondary | ICD-10-CM | POA: Diagnosis not present

## 2019-05-16 DIAGNOSIS — Z1389 Encounter for screening for other disorder: Secondary | ICD-10-CM | POA: Diagnosis not present

## 2019-05-16 DIAGNOSIS — E119 Type 2 diabetes mellitus without complications: Secondary | ICD-10-CM | POA: Diagnosis not present

## 2019-05-16 DIAGNOSIS — Z0001 Encounter for general adult medical examination with abnormal findings: Secondary | ICD-10-CM | POA: Diagnosis not present

## 2019-05-16 DIAGNOSIS — N186 End stage renal disease: Secondary | ICD-10-CM | POA: Diagnosis not present

## 2019-05-16 DIAGNOSIS — I10 Essential (primary) hypertension: Secondary | ICD-10-CM | POA: Diagnosis not present

## 2019-05-16 DIAGNOSIS — Z1331 Encounter for screening for depression: Secondary | ICD-10-CM | POA: Diagnosis not present

## 2019-06-01 DIAGNOSIS — Z992 Dependence on renal dialysis: Secondary | ICD-10-CM | POA: Diagnosis not present

## 2019-06-01 DIAGNOSIS — N186 End stage renal disease: Secondary | ICD-10-CM | POA: Diagnosis not present

## 2019-06-02 DIAGNOSIS — D509 Iron deficiency anemia, unspecified: Secondary | ICD-10-CM | POA: Diagnosis not present

## 2019-06-02 DIAGNOSIS — N186 End stage renal disease: Secondary | ICD-10-CM | POA: Diagnosis not present

## 2019-06-02 DIAGNOSIS — Z992 Dependence on renal dialysis: Secondary | ICD-10-CM | POA: Diagnosis not present

## 2019-06-02 DIAGNOSIS — D631 Anemia in chronic kidney disease: Secondary | ICD-10-CM | POA: Diagnosis not present

## 2019-06-04 ENCOUNTER — Other Ambulatory Visit: Payer: Self-pay

## 2019-06-04 DIAGNOSIS — I739 Peripheral vascular disease, unspecified: Secondary | ICD-10-CM | POA: Diagnosis not present

## 2019-06-04 DIAGNOSIS — B351 Tinea unguium: Secondary | ICD-10-CM | POA: Diagnosis not present

## 2019-06-04 DIAGNOSIS — L110 Acquired keratosis follicularis: Secondary | ICD-10-CM | POA: Diagnosis not present

## 2019-07-02 DIAGNOSIS — Z992 Dependence on renal dialysis: Secondary | ICD-10-CM | POA: Diagnosis not present

## 2019-07-02 DIAGNOSIS — N186 End stage renal disease: Secondary | ICD-10-CM | POA: Diagnosis not present

## 2019-07-03 DIAGNOSIS — Z23 Encounter for immunization: Secondary | ICD-10-CM | POA: Diagnosis not present

## 2019-07-03 DIAGNOSIS — D631 Anemia in chronic kidney disease: Secondary | ICD-10-CM | POA: Diagnosis not present

## 2019-07-03 DIAGNOSIS — Z992 Dependence on renal dialysis: Secondary | ICD-10-CM | POA: Diagnosis not present

## 2019-07-03 DIAGNOSIS — N186 End stage renal disease: Secondary | ICD-10-CM | POA: Diagnosis not present

## 2019-07-03 DIAGNOSIS — D509 Iron deficiency anemia, unspecified: Secondary | ICD-10-CM | POA: Diagnosis not present

## 2019-08-01 DIAGNOSIS — N186 End stage renal disease: Secondary | ICD-10-CM | POA: Diagnosis not present

## 2019-08-01 DIAGNOSIS — Z992 Dependence on renal dialysis: Secondary | ICD-10-CM | POA: Diagnosis not present

## 2019-08-02 DIAGNOSIS — Z992 Dependence on renal dialysis: Secondary | ICD-10-CM | POA: Diagnosis not present

## 2019-08-02 DIAGNOSIS — D631 Anemia in chronic kidney disease: Secondary | ICD-10-CM | POA: Diagnosis not present

## 2019-08-02 DIAGNOSIS — N186 End stage renal disease: Secondary | ICD-10-CM | POA: Diagnosis not present

## 2019-09-01 DIAGNOSIS — N186 End stage renal disease: Secondary | ICD-10-CM | POA: Diagnosis not present

## 2019-09-01 DIAGNOSIS — Z992 Dependence on renal dialysis: Secondary | ICD-10-CM | POA: Diagnosis not present

## 2019-09-03 DIAGNOSIS — H43811 Vitreous degeneration, right eye: Secondary | ICD-10-CM | POA: Diagnosis not present

## 2019-09-03 DIAGNOSIS — E113392 Type 2 diabetes mellitus with moderate nonproliferative diabetic retinopathy without macular edema, left eye: Secondary | ICD-10-CM | POA: Diagnosis not present

## 2019-09-03 DIAGNOSIS — H3562 Retinal hemorrhage, left eye: Secondary | ICD-10-CM | POA: Diagnosis not present

## 2019-09-03 DIAGNOSIS — E113311 Type 2 diabetes mellitus with moderate nonproliferative diabetic retinopathy with macular edema, right eye: Secondary | ICD-10-CM | POA: Diagnosis not present

## 2019-09-04 DIAGNOSIS — N186 End stage renal disease: Secondary | ICD-10-CM | POA: Diagnosis not present

## 2019-09-04 DIAGNOSIS — D631 Anemia in chronic kidney disease: Secondary | ICD-10-CM | POA: Diagnosis not present

## 2019-09-04 DIAGNOSIS — Z992 Dependence on renal dialysis: Secondary | ICD-10-CM | POA: Diagnosis not present

## 2019-09-17 DIAGNOSIS — L110 Acquired keratosis follicularis: Secondary | ICD-10-CM | POA: Diagnosis not present

## 2019-09-17 DIAGNOSIS — B351 Tinea unguium: Secondary | ICD-10-CM | POA: Diagnosis not present

## 2019-09-17 DIAGNOSIS — I739 Peripheral vascular disease, unspecified: Secondary | ICD-10-CM | POA: Diagnosis not present

## 2019-10-01 DIAGNOSIS — N186 End stage renal disease: Secondary | ICD-10-CM | POA: Diagnosis not present

## 2019-10-01 DIAGNOSIS — Z992 Dependence on renal dialysis: Secondary | ICD-10-CM | POA: Diagnosis not present

## 2019-10-02 DIAGNOSIS — Z992 Dependence on renal dialysis: Secondary | ICD-10-CM | POA: Diagnosis not present

## 2019-10-02 DIAGNOSIS — D631 Anemia in chronic kidney disease: Secondary | ICD-10-CM | POA: Diagnosis not present

## 2019-10-02 DIAGNOSIS — N186 End stage renal disease: Secondary | ICD-10-CM | POA: Diagnosis not present

## 2019-10-15 DIAGNOSIS — E119 Type 2 diabetes mellitus without complications: Secondary | ICD-10-CM | POA: Diagnosis not present

## 2019-10-15 DIAGNOSIS — D631 Anemia in chronic kidney disease: Secondary | ICD-10-CM | POA: Diagnosis not present

## 2019-10-15 DIAGNOSIS — N186 End stage renal disease: Secondary | ICD-10-CM | POA: Diagnosis not present

## 2019-10-15 DIAGNOSIS — Z1159 Encounter for screening for other viral diseases: Secondary | ICD-10-CM | POA: Diagnosis not present

## 2019-10-15 DIAGNOSIS — Z992 Dependence on renal dialysis: Secondary | ICD-10-CM | POA: Diagnosis not present

## 2019-10-29 DIAGNOSIS — Z992 Dependence on renal dialysis: Secondary | ICD-10-CM | POA: Diagnosis not present

## 2019-11-01 DIAGNOSIS — Z992 Dependence on renal dialysis: Secondary | ICD-10-CM | POA: Diagnosis not present

## 2019-11-01 DIAGNOSIS — N186 End stage renal disease: Secondary | ICD-10-CM | POA: Diagnosis not present

## 2019-11-03 DIAGNOSIS — D631 Anemia in chronic kidney disease: Secondary | ICD-10-CM | POA: Diagnosis not present

## 2019-11-03 DIAGNOSIS — N186 End stage renal disease: Secondary | ICD-10-CM | POA: Diagnosis not present

## 2019-11-03 DIAGNOSIS — Z992 Dependence on renal dialysis: Secondary | ICD-10-CM | POA: Diagnosis not present

## 2019-11-14 DIAGNOSIS — D631 Anemia in chronic kidney disease: Secondary | ICD-10-CM | POA: Diagnosis not present

## 2019-11-14 DIAGNOSIS — D509 Iron deficiency anemia, unspecified: Secondary | ICD-10-CM | POA: Diagnosis not present

## 2019-11-14 DIAGNOSIS — Z23 Encounter for immunization: Secondary | ICD-10-CM | POA: Diagnosis not present

## 2019-11-14 DIAGNOSIS — Z992 Dependence on renal dialysis: Secondary | ICD-10-CM | POA: Diagnosis not present

## 2019-11-14 DIAGNOSIS — N186 End stage renal disease: Secondary | ICD-10-CM | POA: Diagnosis not present

## 2019-11-26 DIAGNOSIS — Z992 Dependence on renal dialysis: Secondary | ICD-10-CM | POA: Diagnosis not present

## 2020-03-17 ENCOUNTER — Encounter (INDEPENDENT_AMBULATORY_CARE_PROVIDER_SITE_OTHER): Payer: PPO | Admitting: Ophthalmology

## 2020-07-01 IMAGING — XA IR THROMBECTOMY AV FISTULA W/THROMBOLYSIS/PTA INC/SHUNT/IMG*L*
3 series · 13 of 18 positions shown · IV contrast (IODINE)
Comparison: None - this is the first intervention the patient's
chronic left upper arm AV fistula which has been in good working
order for the past at least 4 years.

INDICATION: History of chronic well-functioning left upper extremity AV fistula
with one episode of difficult cannulation this past [REDACTED].

Please perform diagnostic fistulagram and potential intervention as
indicated.
EXAM:
DIALYSIS AV SHUNTOGRAM/FISTULAGRAM

[Series 1: body 4 care · 6 of 8 slices shown (1 of 3)]
[im 1/8]
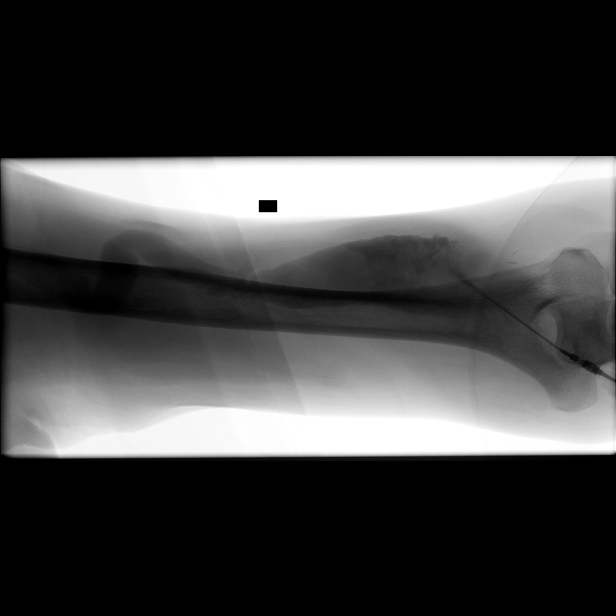
[im 3/8]
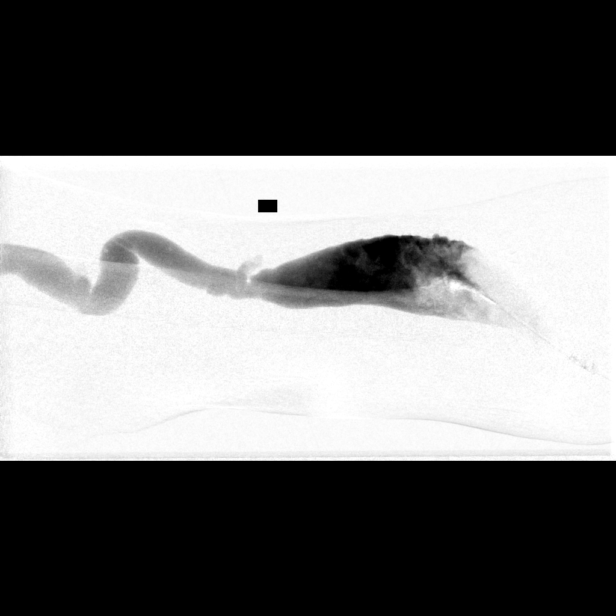
[im 4/8]
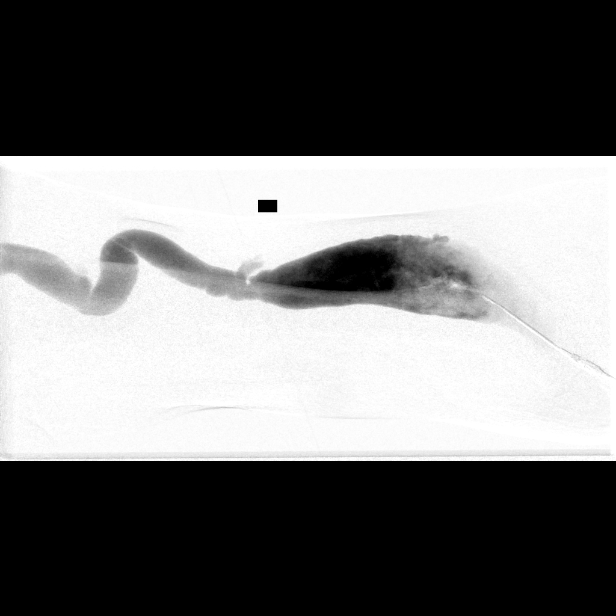
[im 5/8]
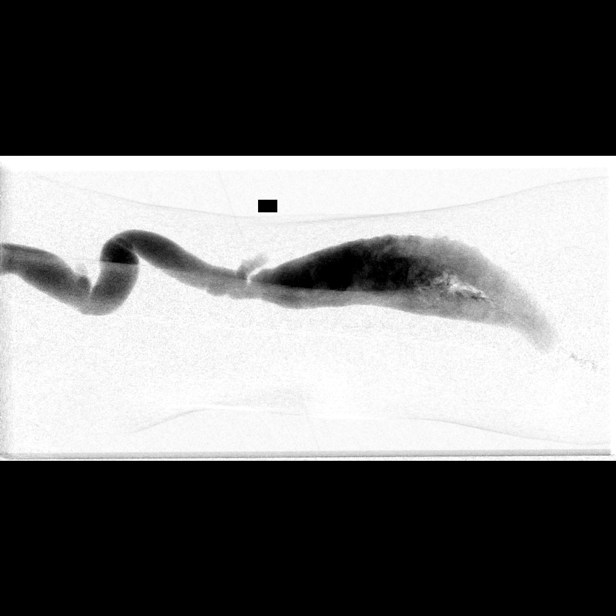
[im 7/8]
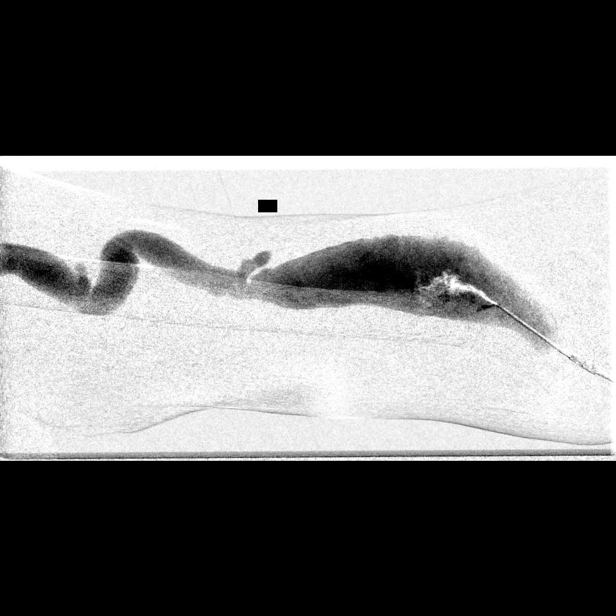
[im 8/8]
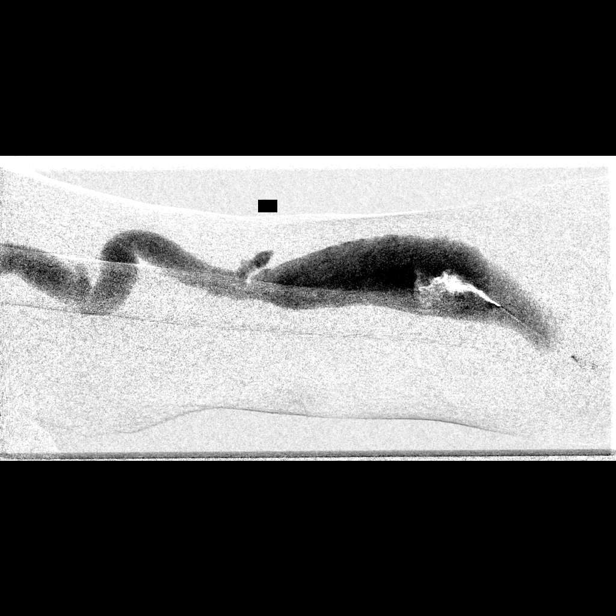

[Series 2: body 4 care · 5 of 7 slices shown (2 of 3)]
[im 2/7]
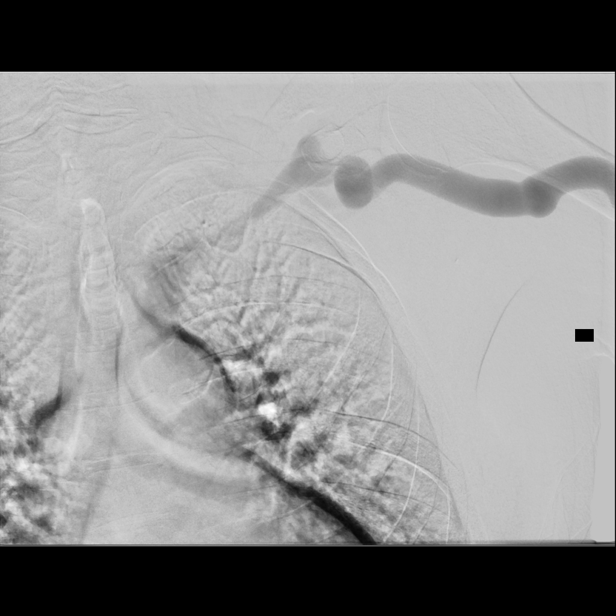
[im 3/7]
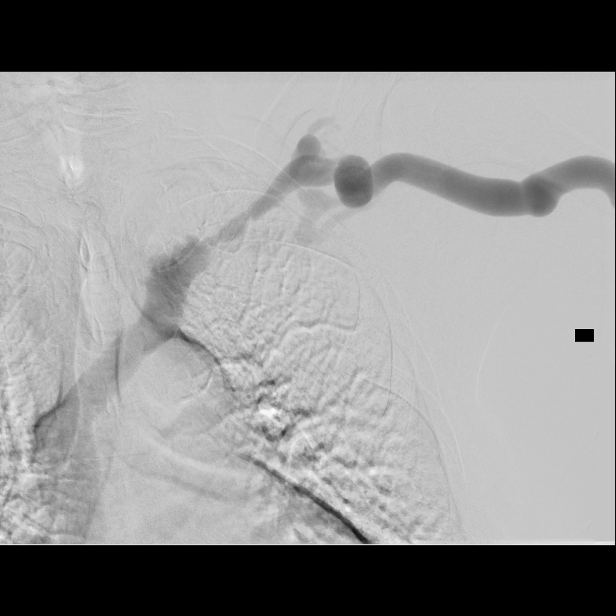
[im 4/7]
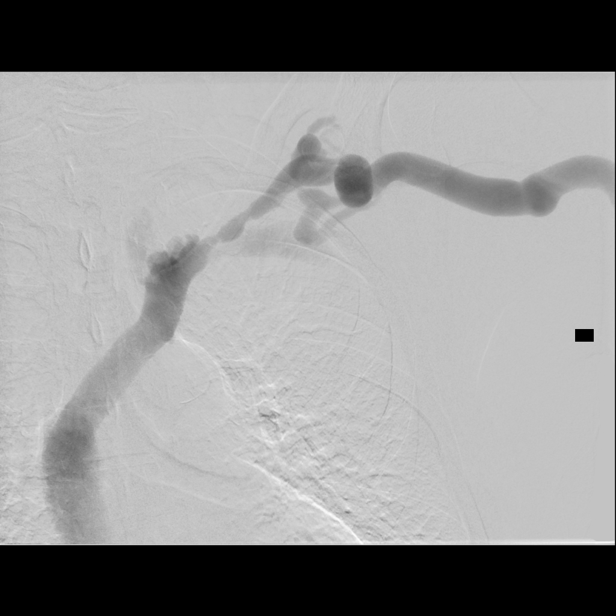
[im 6/7]
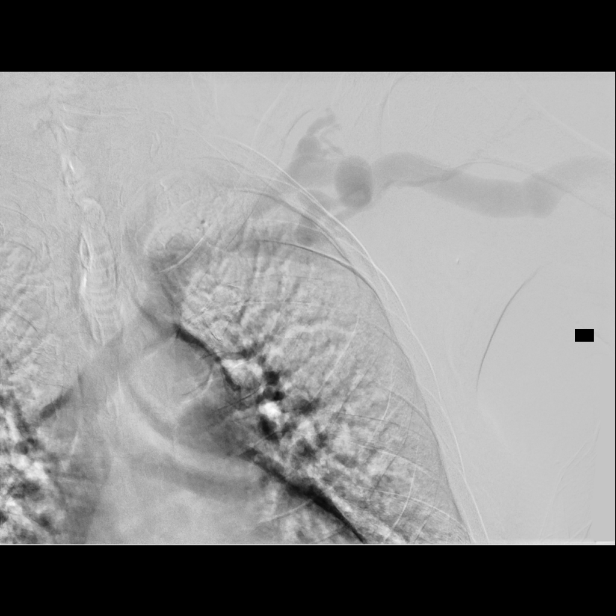
[im 7/7]
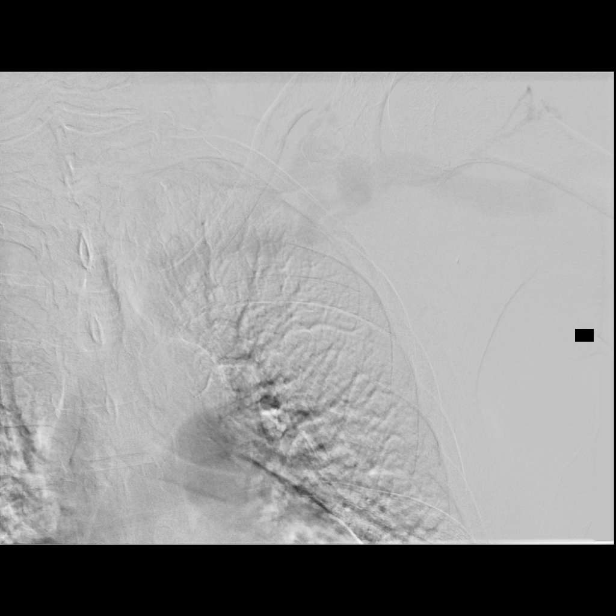

[Series 3: body 4 care · 2 of 3 slices shown (3 of 3)]
[im 1/3]
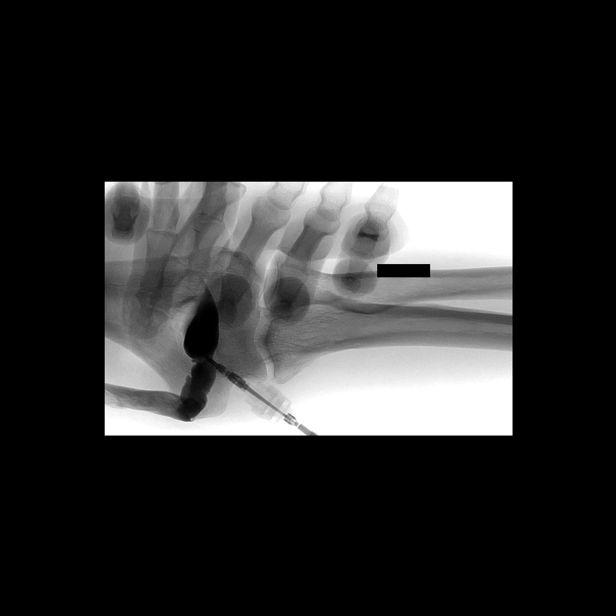
[im 3/3]
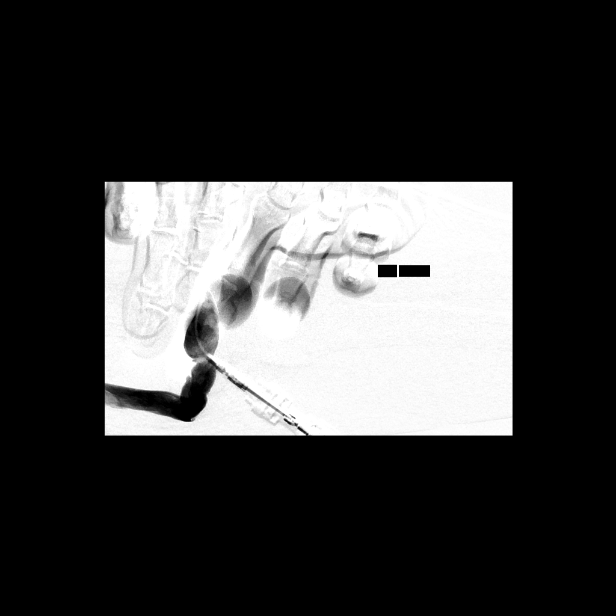

[13 of 18 positions shown; findings below may reference images not displayed]

MEDICATIONS:
None.

CONTRAST:  30mL OMNIPAQUE IOHEXOL 300 MG/ML  SOLN

ANESTHESIA/SEDATION:
None

FLUOROSCOPY TIME:  18 seconds (0.2 mGy)

COMPLICATIONS:
None immediate.

PROCEDURE:
On physical examination, the patient's chronic hypertrophied left
upper arm AV fistula demonstrates an easily palpable pulse.

The skin overlying the left upper arm dialysis fistula was prepped
with Betadine in a sterile fashion, and a sterile drape was applied
covering the operative field. A diagnostic shunt study was performed
via an 18 gauge angiocatheter introduced into venous outflow. Venous
drainage was assessed to the level of the central veins in the
chest. Proximal shunt was studied by reflux maneuver with temporary
compression of venous outflow.

The angiocath was removed and hemostasis was achieved with manual
compression. A dressing was placed. The patient tolerated the
procedure well without immediate post procedural complication.
FINDINGS: The patient's chronic left upper arm brachial-cephalic AV fistula is
widely patent.

There is mild aneurysmal dilatation about the stick zone of the
fistula without significant mural thrombus.

The cephalic vein is tortuous though appears widely patent.

There are two tandem areas of mild (approximately 25%) narrowing
involving the central most aspect of the cephalic arch, however
there are adjacent hypertrophied venous collaterals at the level
left axilla which provide collateral supply to the adjacent axillary
vein, and as such, these mild apparent narrowings are felt to be of
doubtful hemodynamic significance.

The remainder of the central venous system of the left upper
extremity is widely patent to the level of the superior aspect the
right atrium.

Reflux fistulagram demonstrates wide patency of the arterial limb.
There is suboptimal opacification of the anastomosis, however repeat
fistulagram images were not obtained secondary to the marked
patulous nature of the patient's upper arm fistula.
IMPRESSION: Widely patent chronic left upper arm brachial-cephalic AV fistula
without definitive evidence of a peripheral or central stenosis. No
intervention performed.

ACCESS:
This access remains amenable to future percutaneous interventions as
clinically indicated.

## 2021-10-01 DEATH — deceased
